# Patient Record
Sex: Female | Born: 1990 | Race: White | Hispanic: No | Marital: Married | Smoking: Never smoker
Health system: Southern US, Community
[De-identification: ages and names within clinical notes are randomized; demographics above are authoritative.]

## PROBLEM LIST (undated history)

## (undated) DIAGNOSIS — N83209 Unspecified ovarian cyst, unspecified side: Secondary | ICD-10-CM

## (undated) DIAGNOSIS — K602 Anal fissure, unspecified: Secondary | ICD-10-CM

## (undated) DIAGNOSIS — B379 Candidiasis, unspecified: Secondary | ICD-10-CM

## (undated) DIAGNOSIS — R1013 Epigastric pain: Secondary | ICD-10-CM

## (undated) DIAGNOSIS — K648 Other hemorrhoids: Secondary | ICD-10-CM

## (undated) DIAGNOSIS — D2310 Other benign neoplasm of skin of unspecified eyelid, including canthus: Secondary | ICD-10-CM

## (undated) DIAGNOSIS — K219 Gastro-esophageal reflux disease without esophagitis: Secondary | ICD-10-CM

## (undated) DIAGNOSIS — K589 Irritable bowel syndrome without diarrhea: Secondary | ICD-10-CM

## (undated) DIAGNOSIS — N390 Urinary tract infection, site not specified: Secondary | ICD-10-CM

## (undated) DIAGNOSIS — B009 Herpesviral infection, unspecified: Secondary | ICD-10-CM

## (undated) HISTORY — DX: Urinary tract infection, site not specified: N39.0

## (undated) HISTORY — DX: Gastro-esophageal reflux disease without esophagitis: K21.9

## (undated) HISTORY — DX: Other benign neoplasm of skin of unspecified eyelid, including canthus: D23.10

## (undated) HISTORY — DX: Unspecified ovarian cyst, unspecified side: N83.209

## (undated) HISTORY — PX: TYMPANOSTOMY: SHX2586

## (undated) HISTORY — DX: Epigastric pain: R10.13

## (undated) HISTORY — DX: Other hemorrhoids: K64.8

## (undated) HISTORY — PX: TONSILLECTOMY: SUR1361

## (undated) HISTORY — PX: NASAL SINUS SURGERY: SHX719

## (undated) HISTORY — PX: MYRINGOTOMY: SUR874

## (undated) HISTORY — DX: Anal fissure, unspecified: K60.2

---
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
## 2011-01-13 ENCOUNTER — Encounter: Payer: Self-pay | Admitting: Internal Medicine

## 2011-01-13 ENCOUNTER — Ambulatory Visit (INDEPENDENT_AMBULATORY_CARE_PROVIDER_SITE_OTHER): Payer: 59 | Admitting: Internal Medicine

## 2011-01-13 VITALS — BP 118/64 | HR 88 | Ht 67.0 in | Wt 107.0 lb

## 2011-01-13 DIAGNOSIS — R634 Abnormal weight loss: Secondary | ICD-10-CM

## 2011-01-13 DIAGNOSIS — R198 Other specified symptoms and signs involving the digestive system and abdomen: Secondary | ICD-10-CM

## 2011-01-13 DIAGNOSIS — K3189 Other diseases of stomach and duodenum: Secondary | ICD-10-CM

## 2011-01-13 DIAGNOSIS — R1013 Epigastric pain: Secondary | ICD-10-CM

## 2011-01-13 MED ORDER — BUSPIRONE HCL 10 MG PO TABS: ORAL_TABLET | ORAL | Status: DC

## 2011-01-13 NOTE — Patient Instructions (Addendum)
Your prescription(s) has(have) been sent to your pharmacy for you to pick up. Stop the Align. Return to see Dr. Leone Payor in 6 weeks.

## 2011-01-13 NOTE — Progress Notes (Signed)
  Subjective:    Patient ID: Crystal [name], female    DOB: [birth year], 20 y.o.   MRN: [id number]  HPIVery pleasant 20 yo white woman that has been seen for abdominal pain and weight loss over the past several months. She recently underwent a  Diagnostic laparoscopy by her GYN MD and that was unremarkable. She is here for reassessment today with persistent difficulties with early satiety and post-prandil abdominal pains.  Appetite present but has significant early satiety, nausea and bloating.. No diet change. Has lost weight, was 115 and now #107. Out of school, doing some dog sitting. Says life is stress-free. + headaches, past 3 days straight Overall not typical. Sleeping ok, maybe too good she says. Smiling and laughing in interview. Mom is present for latter part.     Review of Systems As per HPI    Objective:   Physical Exam Thin but healthy-appearing Abdomen scaphoid, soft and nontender with normal BS Appropriate mood and affect, sense of humor displayed in interview       Assessment & Plan:

## 2011-01-13 NOTE — Assessment & Plan Note (Addendum)
Still bothered with early satiety and weight loss. 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 Does not seem to have other issues except ? "sleeping too much" though do not get a sense of significant fatigue Will try buspirone. Rationale to improve gastric accomodation explained to patient and mom. Plan follow-up in about 6 weeks.

## 2011-01-14 NOTE — Assessment & Plan Note (Signed)
Still an issue. I encouraged frequent, smaller and high-calorie intake. Await trial of buspirone.

## 2011-01-14 NOTE — Assessment & Plan Note (Signed)
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 EGD, laparoscopy, labs are unrevealing. Trial of buspirone which is known to help these symptoms. If that fails probable tertiary referral depending upon quality of life and weight status.

## 2011-02-23 ENCOUNTER — Encounter: Payer: Self-pay | Admitting: Internal Medicine

## 2011-02-23 ENCOUNTER — Ambulatory Visit (INDEPENDENT_AMBULATORY_CARE_PROVIDER_SITE_OTHER): Payer: 59 | Admitting: Internal Medicine

## 2011-02-23 VITALS — BP 98/62 | HR 74 | Ht 67.0 in | Wt 110.0 lb

## 2011-02-23 DIAGNOSIS — R198 Other specified symptoms and signs involving the digestive system and abdomen: Secondary | ICD-10-CM

## 2011-02-23 DIAGNOSIS — R1013 Epigastric pain: Secondary | ICD-10-CM

## 2011-02-23 DIAGNOSIS — K3189 Other diseases of stomach and duodenum: Secondary | ICD-10-CM

## 2011-02-23 NOTE — Progress Notes (Signed)
"   I love eating a lot - I am hungry" Doing well since buspirone No nightmares or dystonia - sleep at times so taking in afternoon and bedtime Still has headaches relieved by ibuprofen - 4 times a week

## 2011-02-23 NOTE — Patient Instructions (Addendum)
You may try Buspirone 1 tablet at night only or 1/2 tablet to reduce your sleepiness to help you  feel better.  Have your pharmacy contact our office when it's time for a refill.

## 2011-02-24 NOTE — Assessment & Plan Note (Signed)
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

## 2011-02-24 NOTE — Assessment & Plan Note (Signed)
Treated with buspirone

## 2011-02-24 NOTE — Assessment & Plan Note (Signed)
Significant improvement in symptoms (minimal pain, appetite and eight are up) on buspirone 10 mg bid. Some sleepiness with that. She will titrate dosing ? 5 mg bid, 10 mg qhs to seee if she can reduce side effects and maintain efficacy.

## 2011-03-01 ENCOUNTER — Ambulatory Visit (INDEPENDENT_AMBULATORY_CARE_PROVIDER_SITE_OTHER): Payer: 59 | Admitting: *Deleted

## 2011-03-01 DIAGNOSIS — Z0000 Encounter for general adult medical examination without abnormal findings: Secondary | ICD-10-CM

## 2011-03-03 ENCOUNTER — Encounter: Payer: Self-pay | Admitting: *Deleted

## 2011-03-03 LAB — TB SKIN TEST
Induration: 0
TB Skin Test: NEGATIVE mm

## 2011-04-06 ENCOUNTER — Inpatient Hospital Stay (INDEPENDENT_AMBULATORY_CARE_PROVIDER_SITE_OTHER)
Admission: RE | Admit: 2011-04-06 | Discharge: 2011-04-06 | Disposition: A | Discharge status: Home / Self Care | Payer: 59 | Source: Ambulatory Visit | Attending: Family Medicine | Admitting: Family Medicine

## 2011-04-06 ENCOUNTER — Encounter: Payer: Self-pay | Admitting: Family Medicine

## 2011-04-06 DIAGNOSIS — J069 Acute upper respiratory infection, unspecified: Secondary | ICD-10-CM

## 2011-05-02 LAB — URINALYSIS, ROUTINE W REFLEX MICROSCOPIC
Bilirubin Urine: NEGATIVE
Glucose, UA: NEGATIVE
Hgb urine dipstick: NEGATIVE
Ketones, ur: 15 — AB
Nitrite: NEGATIVE
Protein, ur: NEGATIVE
Specific Gravity, Urine: 1.013
Urobilinogen, UA: 1
pH: 5.5

## 2011-05-02 LAB — URINE MICROSCOPIC-ADD ON

## 2011-05-02 LAB — URINE CULTURE: Colony Count: 10000

## 2011-05-02 LAB — PREGNANCY, URINE: Preg Test, Ur: NEGATIVE

## 2011-05-02 LAB — MONONUCLEOSIS SCREEN: Mono Screen: NEGATIVE

## 2011-05-02 LAB — RAPID STREP SCREEN (MED CTR MEBANE ONLY): Streptococcus, Group A Screen (Direct): NEGATIVE

## 2011-05-15 LAB — POCT HEMOGLOBIN-HEMACUE
Hemoglobin: 14.8 — ABNORMAL HIGH
Operator id: 154361

## 2011-07-03 NOTE — Progress Notes (Signed)
Summary: Possible URI/sinus infx (room 3)   Vital Signs:  Patient Profile:   20 Years Old Female CC:      possible sinus infx x 2 days (hx of) Height:     64.5 inches Weight:      111.50 pounds O2 Sat:      96 % O2 treatment:    Room Air Temp:     98.4 degrees F oral Pulse rate:   81 / minute Resp:     16 per minute BP sitting:   118 / 81  (left arm) Cuff size:   regular  Pt. in pain?   yes    Location:   sinus area  Vitals Entered By: Lavell Islam RN (April 06, 2011 10:08 AM)                   Updated Prior Medication List: * MVI 1 by mouth once daily * HAIR, SKIN, AND NAILS VIT 1 by mouth once daily ZEOSA 0.4-35 MG-MCG CHEW (NORETHIN-ETH ESTRADIOL-FE) as directed BUSPIRONE HCL 10 MG TABS (BUSPIRONE HCL) as needed for GI pain  Current Allergies: No known allergies History of Present Illness Chief Complaint: possible sinus infx x 2 days (hx of) History of Present Illness:  Subjective: Patient complains of URI symptoms for 5 days. + mild sore throat, resolved + cough worse at night No pleuritic pain No wheezing + nasal congestion ? post-nasal drainage No sinus pain/pressure No itchy/red eyes ? earache; ears feel congested No hemoptysis No SOB No fever, + chills No nausea No vomiting No abdominal pain No diarrhea No skin rashes + fatigue No myalgias + headache Used OTC meds without relief   REVIEW OF SYSTEMS Constitutional Symptoms       Complains of fever.     Denies chills, night sweats, weight loss, weight gain, and fatigue.  Eyes       Denies change in vision, eye pain, eye discharge, glasses, contact lenses, and eye surgery. Ear/Nose/Throat/Mouth       Complains of frequent runny nose, sinus problems, and sore throat.      Denies hearing loss/aids, change in hearing, ear pain, ear discharge, dizziness, frequent nose bleeds, hoarseness, and tooth pain or bleeding.  Respiratory       Complains of dry cough.      Denies productive cough,  wheezing, shortness of breath, asthma, bronchitis, and emphysema/COPD.  Cardiovascular       Denies murmurs, chest pain, and tires easily with exhertion.    Gastrointestinal       Denies stomach pain, nausea/vomiting, diarrhea, constipation, blood in bowel movements, and indigestion. Genitourniary       Denies painful urination, kidney stones, and loss of urinary control. Neurological       Complains of headaches.      Denies paralysis, seizures, and fainting/blackouts. Musculoskeletal       Denies muscle pain, joint pain, joint stiffness, decreased range of motion, redness, swelling, muscle weakness, and gout.  Skin       Denies bruising, unusual mles/lumps or sores, and hair/skin or nail changes.  Psych       Denies mood changes, temper/anger issues, anxiety/stress, speech problems, depression, and sleep problems. Other Comments: URI vs. sinus infx   Past History:  Past Medical History: GERD Urinary Tract Infection Ovarian cyst sinus infxs pneumonia mono strep  Past Surgical History: myringotomy x 2  tympanostomy  tonsillectomy sinus surgery exploratory Lap 5/12   Family History: Reviewed history from 10/10/2010 and  no changes required. Mother, Diabetes, HTN Father, Healthy No FH of Colon Cancer: Family History of Breast Cancer:MGM Family History of Heart Disease: MGF Family History of Irritable Bowel Syndrome: Fathe   Social History: Reviewed history from 10/10/2010 and no changes required. 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  Alcohol Use - no Illicit Drug Use - no Daily Caffeine Use: 0 daily    Objective:  Appearance:  Patient appears healthy, stated age, and in no acute distress  Eyes:  Pupils are equal, round, and reactive to light and accomodation.  Extraocular movement is intact.  Conjunctivae are not inflamed.  Ears:  Canals normal.  Tympanic membranes normal.   Nose:  Mildly congested  turbinates.  No sinus tenderness  Pharynx:  Normal  Neck:  Supple.  No adenopathy is present.  Lungs:  Clear to auscultation.  Breath sounds are equal.  Heart:  Regular rate and rhythm without murmurs, rubs, or gallops.  Abdomen:  Nontender without masses or hepatosplenomegaly.  Bowel sounds are present.  No CVA or flank tenderness.  Skin:  No rash Assessment New Problems: UPPER RESPIRATORY INFECTION, ACUTE (ICD-465.9)  NO EVIDENCE BACTERIAL INFECTION TODAY  Plan New Medications/Changes: AZITHROMYCIN 250 MG TABS (AZITHROMYCIN) Two tabs by mouth on day 1, then 1 tab daily on days 2 through 5 (Rx void after 04/14/11)  #6 tabs x 0, 04/06/2011, Donna Christen MD BENZONATATE 200 MG CAPS (BENZONATATE) One by mouth hs as needed cough  #12 x 0, 04/06/2011, Donna Christen MD  New Orders: Est. Patient Level III [60454] Pulse Oximetry (single measurment) [09811] Planning Comments:   Treat symptomatically for now:  Increase fluid intake, begin expectorant/decongestant, topical decongestant,  cough suppressant at bedtime.  If fever/chills/sweats persist, or if not improving 5  days begin Z-pack (given Rx to hold).  Followup with PCP if not improving 7 to 10 days.   The patient and/or caregiver has been counseled thoroughly with regard to medications prescribed including dosage, schedule, interactions, rationale for use, and possible side effects and they verbalize understanding.  Diagnoses and expected course of recovery discussed and will return if not improved as expected or if the condition worsens. Patient and/or caregiver verbalized understanding.  Prescriptions: AZITHROMYCIN 250 MG TABS (AZITHROMYCIN) Two tabs by mouth on day 1, then 1 tab daily on days 2 through 5 (Rx void after 04/14/11)  #6 tabs x 0   Entered and Authorized by:   Donna Christen MD   Signed by:   Donna Christen MD on 04/06/2011   Method used:   Print then Give to Patient   RxID:   [id number] BENZONATATE 200 MG CAPS  (BENZONATATE) One by mouth hs as needed cough  #12 x 0   Entered and Authorized by:   Donna Christen MD   Signed by:   Donna Christen MD on 04/06/2011   Method used:   Print then Give to Patient   RxID:   [id number]   Patient Instructions: 1)  Take Mucinex D (guaifenesin with decongestant) twice daily for congestion. 2)  Increase fluid intake, rest. 3)  May use Afrin nasal spray (or generic oxymetazoline) twice daily for about 5 days.  Also recommend using saline nasal spray several times daily and/or saline nasal irrigation. 4)  Begin Azithromycin if not improving about 5 days or if persistent fever develops. 5)  Followup with family doctor if not improving 7 to 10 days.   Orders Added: 1)  Est. Patient Level  III [13086] 2)  Pulse Oximetry (single measurment) [57846]

## 2011-10-03 ENCOUNTER — Telehealth: Payer: Self-pay | Admitting: Internal Medicine

## 2011-10-03 NOTE — Telephone Encounter (Signed)
Patient reports intermittent rectal bleeding about once a week.  Denies abdominal pain.  She is not sure where that came from in the information for the call.  She reports that she is unable to gain weight and this is upsetting.  She reports that she is 107 lbs and has been this way for 1 year.  The patient will come in and see Dr Leone Payor on 10/09/11

## 2011-10-09 ENCOUNTER — Ambulatory Visit (INDEPENDENT_AMBULATORY_CARE_PROVIDER_SITE_OTHER): Payer: 59 | Admitting: Internal Medicine

## 2011-10-09 ENCOUNTER — Encounter: Payer: Self-pay | Admitting: Internal Medicine

## 2011-10-09 VITALS — BP 100/70 | HR 68 | Ht 67.0 in | Wt 115.0 lb

## 2011-10-09 DIAGNOSIS — K648 Other hemorrhoids: Secondary | ICD-10-CM | POA: Insufficient documentation

## 2011-10-09 DIAGNOSIS — K602 Anal fissure, unspecified: Secondary | ICD-10-CM

## 2011-10-09 HISTORY — DX: Anal fissure, unspecified: K60.2

## 2011-10-09 HISTORY — DX: Other hemorrhoids: K64.8

## 2011-10-09 MED ORDER — AMBULATORY NON FORMULARY MEDICATION: Status: DC

## 2011-10-09 NOTE — Progress Notes (Signed)
  Subjective:    Patient ID: Crystal [name], female    DOB: 1990/10/12, 21 y.o.   MRN: [id number]  HPI Pleasant 21 yo ww with 1 month history of painful defecation and bright red blood on toilet paper. She denies abdominal pain. No straining or hard stools. Wants to avoid defecation because of the painful defecation. Has never had problems like this before. Hoping to transfer from [facility] to [facility] for nusring.  No Known Allergies Outpatient Prescriptions Prior to Visit  Medication Sig Dispense Refill  . busPIRone (BUSPAR) 10 MG tablet 1/2 tab twice a day for 2 weeks then 1 tab twice a day  60 tablet  2  . desogestrel-ethinyl estradiol (KARIVA,AZURETTE,MIRCETTE) 0.15-0.02/0.01 MG (21/5) tablet Take 1 tablet by mouth daily.        . Multiple Vitamin (MULTIVITAMIN) capsule Take 1 capsule by mouth daily.        . Multiple Vitamins-Minerals (HAIR/SKIN/NAILS PO) Take 1 tablet by mouth daily.         Past Medical History  Diagnosis Date  . GERD (gastroesophageal reflux disease)   . UTI (lower urinary tract infection)   . Ovarian cyst   . Dyspepsia   . Benign neoplasm of eyelid including canthus    Past Surgical History  Procedure Date  . Myringotomy     x2  . Tympanostomy   . Tonsillectomy   . Nasal sinus surgery   . Laparoscopy 12/19/10  . 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 Anal fissure   2. Internal hemorrhoids    #1 diltiazem gel 2% twice daily to the anal area #2 Metamucil 1 tablespoon daily to ensure formed to soft stools #3 return in 6 weeks for followup. Have explained the nature of anal fissure  and hemorrhoids. Education handout will be given.

## 2011-10-09 NOTE — Patient Instructions (Signed)
We have sent the following medications to your pharmacy for you to pick up at your convenience: Diltiazem gel and take as directed. Start taking Metamucil one tablespoon daily and this medication is over the counter. We have given you a handout on Anal Fissure today.

## 2011-10-11 ENCOUNTER — Ambulatory Visit: Payer: 59 | Admitting: Internal Medicine

## 2012-01-08 ENCOUNTER — Emergency Department
Admission: EM | Admit: 2012-01-08 | Discharge: 2012-01-08 | Disposition: A | Discharge status: Home / Self Care | Payer: Self-pay | Source: Home / Self Care | Attending: Family Medicine | Admitting: Family Medicine

## 2012-01-08 ENCOUNTER — Encounter: Payer: Self-pay | Admitting: *Deleted

## 2012-01-08 DIAGNOSIS — H6980 Other specified disorders of Eustachian tube, unspecified ear: Secondary | ICD-10-CM

## 2012-01-08 DIAGNOSIS — J029 Acute pharyngitis, unspecified: Secondary | ICD-10-CM

## 2012-01-08 LAB — POCT RAPID STREP A (OFFICE): Rapid Strep A Screen: NEGATIVE

## 2012-01-08 MED ORDER — PREDNISONE 20 MG PO TABS: 20.0000 mg | ORAL_TABLET | Freq: Two times a day (BID) | ORAL | Status: AC

## 2012-01-08 NOTE — Discharge Instructions (Signed)
Take Mucinex D (guaifenesin with decongestant) twice daily for congestion.  Increase fluid intake, rest. May use Afrin nasal spray (or generic oxymetazoline) twice daily for about 5 days.  Also recommend using saline nasal spray several times daily and saline nasal irrigation (AYR is a common brand) Stop all antihistamines for now, and other non-prescription cough/cold preparations.     

## 2012-01-08 NOTE — ED Notes (Signed)
Pt c/o sore throat and RT ear ache x 1wk. Denies fever. She has taken zyrtec and tylenol.

## 2012-01-08 NOTE — ED Provider Notes (Signed)
History     CSN: [id number]  Arrival date & time 01/08/12  1000   First MD Initiated Contact with Patient 01/08/12 1036      Chief Complaint  Patient presents with  . Sore Throat  . Otalgia      HPI Comments: Patient complains of approximately 7 day history of persistent sinus congestion and mild soreness in right neck/throat.  She has had a minimal cough.  No fatigue or myalgias.  She states that her right ear feels clogged.  There has been no pleuritic pain, shortness of breath, or wheezes.  No fevers, chills, and sweats.  She states that her congestion did not respond to Zyrtec.  She has had sinus surgery in the past.  The history is provided by the patient.    Past Medical History  Diagnosis Date  . GERD (gastroesophageal reflux disease)   . UTI (lower urinary tract infection)   . Ovarian cyst   . Dyspepsia   . Benign neoplasm of eyelid including canthus     Past Surgical History  Procedure Date  . Myringotomy     x2  . Tympanostomy   . Tonsillectomy   . Nasal sinus surgery   . Laparoscopy 12/19/10  . Upper gastrointestinal endoscopy 10/17/2010    normal    Family History  Problem Relation Age of Onset  . Diabetes Mother   . Hypertension Mother   . Irritable bowel syndrome Father   . Breast cancer Maternal Grandmother   . Heart disease Maternal Grandfather   . Colon cancer Neg Hx     History  Substance Use Topics  . Smoking status: Never Smoker   . Smokeless tobacco: Never Used  . Alcohol Use: No    OB History    Grav Para Term Preterm Abortions TAB SAB Ect Mult Living                  Review of Systems + sore throat + occasional cough No pleuritic pain No wheezing + nasal congestion ? post-nasal drainage No sinus pain/pressure No itchy/red eyes ? right earache; right ear feels clogged No hemoptysis No SOB No fever/chills No nausea No vomiting No abdominal pain No diarrhea No urinary symptoms No skin rashes No fatigue No  myalgias + frontal headache Used OTC meds without relief (Zyrtec) Allergies  Review of patient's allergies indicates no known allergies.  Home Medications   Current Outpatient Rx  Name Route Sig Dispense Refill  . AMBULATORY NON FORMULARY MEDICATION  Medication Name: Diltiazem gel 2% Apply small amount to anal area twice a day 30 g 1  . BUSPIRONE HCL 10 MG PO TABS  1/2 tab twice a day for 2 weeks then 1 tab twice a day 60 tablet 2  . DESOGESTREL-ETHINYL ESTRADIOL 0.15-0.02/0.01 MG (21/5) PO TABS Oral Take 1 tablet by mouth daily.      . MULTIVITAMINS PO CAPS Oral Take 1 capsule by mouth daily.      Marland Kitchen HAIR/SKIN/NAILS PO Oral Take 1 tablet by mouth daily.      Marland Kitchen PREDNISONE 20 MG PO TABS Oral Take 1 tablet (20 mg total) by mouth 2 (two) times daily. Take with food 8 tablet 0    BP 104/67  Pulse 88  Temp(Src) 98.8 F (37.1 C) (Oral)  Resp 16  Ht 5\' 6"  (1.676 m)  Wt 126 lb 4 oz (57.267 kg)  BMI 20.38 kg/m2  SpO2 99%  LMP 12/11/2011  Physical Exam Nursing notes and Vital Signs  reviewed. Appearance:  Patient appears healthy, stated age, and in no acute distress Eyes:  Pupils are equal, round, and reactive to light and accomodation.  Extraocular movement is intact.  Conjunctivae are not inflamed  Ears:  Canals normal.   Right tympanic membrane is scarred but no acute inflammation present.  Left tympanic membrane normal Nose:  Mildly congested turbinates.  No sinus tenderness.  Pharynx:  Normal Neck:  Supple.  Slightly tender shotty right posterior nodes  Lungs:  Clear to auscultation.  Breath sounds are equal.  Heart:  Regular rate and rhythm without murmurs, rubs, or gallops.  Abdomen:  Nontender without masses or hepatosplenomegaly.  Bowel sounds are present.  No CVA or flank tenderness.  Skin:  No rash present.   ED Course  Procedures none   Labs Reviewed  POCT RAPID STREP A (OFFICE) negative      1. Eustachian tube dysfunction   Tympanogram reveals consistent open  message right ear (no perforation seen however)    MDM  Begin prednisone burst. Take Mucinex D (guaifenesin with decongestant) twice daily for congestion.  Increase fluid intake May use Afrin nasal spray (or generic oxymetazoline) twice daily for about 5 days.  Also recommend using saline nasal spray several times daily and saline nasal irrigation (AYR is a common brand) Stop all antihistamines for now, and other non-prescription cough/cold preparations. Followup with ENT if not improving          Lattie Haw, MD 01/08/12 1105

## 2012-01-26 ENCOUNTER — Ambulatory Visit (INDEPENDENT_AMBULATORY_CARE_PROVIDER_SITE_OTHER): Payer: 59 | Admitting: Family Medicine

## 2012-01-26 ENCOUNTER — Encounter: Payer: Self-pay | Admitting: Family Medicine

## 2012-01-26 VITALS — BP 110/64 | HR 79 | Temp 97.9°F | Resp 16 | Ht 65.0 in | Wt 123.8 lb

## 2012-01-26 DIAGNOSIS — Z0000 Encounter for general adult medical examination without abnormal findings: Secondary | ICD-10-CM

## 2012-01-26 DIAGNOSIS — Z23 Encounter for immunization: Secondary | ICD-10-CM

## 2012-01-26 DIAGNOSIS — Z111 Encounter for screening for respiratory tuberculosis: Secondary | ICD-10-CM

## 2012-01-26 LAB — CBC WITH DIFFERENTIAL/PLATELET
Basophils Absolute: 0 10*3/uL (ref 0.0–0.1)
Basophils Relative: 1 % (ref 0–1)
Eosinophils Absolute: 0 10*3/uL (ref 0.0–0.7)
Eosinophils Relative: 0 % (ref 0–5)
HCT: 38.3 % (ref 36.0–46.0)
Hemoglobin: 13.8 g/dL (ref 12.0–15.0)
Lymphocytes Relative: 32 % (ref 12–46)
Lymphs Abs: 2.3 10*3/uL (ref 0.7–4.0)
MCH: 31.7 pg (ref 26.0–34.0)
MCHC: 36 g/dL (ref 30.0–36.0)
MCV: 88 fL (ref 78.0–100.0)
Monocytes Absolute: 0.5 10*3/uL (ref 0.1–1.0)
Monocytes Relative: 8 % (ref 3–12)
Neutro Abs: 4.3 10*3/uL (ref 1.7–7.7)
Neutrophils Relative %: 59 % (ref 43–77)
Platelets: 243 10*3/uL (ref 150–400)
RBC: 4.35 MIL/uL (ref 3.87–5.11)
RDW: 12.4 % (ref 11.5–15.5)
WBC: 7.2 10*3/uL (ref 4.0–10.5)

## 2012-01-26 LAB — POCT URINALYSIS DIPSTICK
Bilirubin, UA: NEGATIVE
Blood, UA: NEGATIVE
Glucose, UA: NEGATIVE
Ketones, UA: NEGATIVE
Leukocytes, UA: NEGATIVE
Nitrite, UA: NEGATIVE
Protein, UA: NEGATIVE
Spec Grav, UA: 1.025
Urobilinogen, UA: 0.2
pH, UA: 6

## 2012-01-26 NOTE — Patient Instructions (Addendum)
Preventive Care for Adults, Female A healthy lifestyle and preventive care can promote health and wellness. Preventive health guidelines for women include the following key practices.  A routine yearly physical is a good way to check with your caregiver about your health and preventive screening. It is a chance to share any concerns and updates on your health, and to receive a thorough exam.   Visit your dentist for a routine exam and preventive care every 6 months. Brush your teeth twice a day and floss once a day. Good oral hygiene prevents tooth decay and gum disease.   The frequency of eye exams is based on your age, health, family medical history, use of contact lenses, and other factors. Follow your caregiver's recommendations for frequency of eye exams.   Eat a healthy diet. Foods like vegetables, fruits, whole grains, low-fat dairy products, and lean protein foods contain the nutrients you need without too many calories. Decrease your intake of foods high in solid fats, added sugars, and salt. Eat the right amount of calories for you.Get information about a proper diet from your caregiver, if necessary.   Regular physical exercise is one of the most important things you can do for your health. Most adults should get at least 150 minutes of moderate-intensity exercise (any activity that increases your heart rate and causes you to sweat) each week. In addition, most adults need muscle-strengthening exercises on 2 or more days a week.   Maintain a healthy weight. The body mass index (BMI) is a screening tool to identify possible weight problems. It provides an estimate of body fat based on height and weight. Your caregiver can help determine your BMI, and can help you achieve or maintain a healthy weight.For adults 20 years and older:   A BMI below 18.5 is considered underweight.   A BMI of 18.5 to 24.9 is normal.   A BMI of 25 to 29.9 is considered overweight.   A BMI of 30 and above is  considered obese.   Maintain normal blood lipids and cholesterol levels by exercising and minimizing your intake of saturated fat. Eat a balanced diet with plenty of fruit and vegetables. Blood tests for lipids and cholesterol should begin at age 20 and be repeated every 5 years. If your lipid or cholesterol levels are high, you are over 50, or you are at high risk for heart disease, you may need your cholesterol levels checked more frequently.Ongoing high lipid and cholesterol levels should be treated with medicines if diet and exercise are not effective.   If you smoke, find out from your caregiver how to quit. If you do not use tobacco, do not start.   If you are pregnant, do not drink alcohol. If you are breastfeeding, be very cautious about drinking alcohol. If you are not pregnant and choose to drink alcohol, do not exceed 1 drink per day. One drink is considered to be 12 ounces (355 mL) of beer, 5 ounces (148 mL) of wine, or 1.5 ounces (44 mL) of liquor.   Avoid use of street drugs. Do not share needles with anyone. Ask for help if you need support or instructions about stopping the use of drugs.   High blood pressure causes heart disease and increases the risk of stroke. Your blood pressure should be checked at least every 1 to 2 years. Ongoing high blood pressure should be treated with medicines if weight loss and exercise are not effective.   If you are 55 to 20   years old, ask your caregiver if you should take aspirin to prevent strokes.   Diabetes screening involves taking a blood sample to check your fasting blood sugar level. This should be done once every 3 years, after age 45, if you are within normal weight and without risk factors for diabetes. Testing should be considered at a younger age or be carried out more frequently if you are overweight and have at least 1 risk factor for diabetes.   Breast cancer screening is essential preventive care for women. You should practice "breast  self-awareness." This means understanding the normal appearance and feel of your breasts and may include breast self-examination. Any changes detected, no matter how small, should be reported to a caregiver. Women in their 20s and 30s should have a clinical breast exam (CBE) by a caregiver as part of a regular health exam every 1 to 3 years. After age 40, women should have a CBE every year. Starting at age 40, women should consider having a mammography (breast X-ray test) every year. Women who have a family history of breast cancer should talk to their caregiver about genetic screening. Women at a high risk of breast cancer should talk to their caregivers about having magnetic resonance imaging (MRI) and a mammography every year.   The Pap test is a screening test for cervical cancer. A Pap test can show cell changes on the cervix that might become cervical cancer if left untreated. A Pap test is a procedure in which cells are obtained and examined from the lower end of the uterus (cervix).   Women should have a Pap test starting at age 21.   Between ages 21 and 29, Pap tests should be repeated every 2 years.   Beginning at age 30, you should have a Pap test every 3 years as long as the past 3 Pap tests have been normal.   Some women have medical problems that increase the chance of getting cervical cancer. Talk to your caregiver about these problems. It is especially important to talk to your caregiver if a new problem develops soon after your last Pap test. In these cases, your caregiver may recommend more frequent screening and Pap tests.   The above recommendations are the same for women who have or have not gotten the vaccine for human papillomavirus (HPV).   If you had a hysterectomy for a problem that was not cancer or a condition that could lead to cancer, then you no longer need Pap tests. Even if you no longer need a Pap test, a regular exam is a good idea to make sure no other problems are  starting.   If you are between ages 65 and 70, and you have had normal Pap tests going back 10 years, you no longer need Pap tests. Even if you no longer need a Pap test, a regular exam is a good idea to make sure no other problems are starting.   If you have had past treatment for cervical cancer or a condition that could lead to cancer, you need Pap tests and screening for cancer for at least 20 years after your treatment.   If Pap tests have been discontinued, risk factors (such as a new sexual partner) need to be reassessed to determine if screening should be resumed.   The HPV test is an additional test that may be used for cervical cancer screening. The HPV test looks for the virus that can cause the cell changes on the cervix.   The cells collected during the Pap test can be tested for HPV. The HPV test could be used to screen women aged 30 years and older, and should be used in women of any age who have unclear Pap test results. After the age of 30, women should have HPV testing at the same frequency as a Pap test.   Colorectal cancer can be detected and often prevented. Most routine colorectal cancer screening begins at the age of 50 and continues through age 75. However, your caregiver may recommend screening at an earlier age if you have risk factors for colon cancer. On a yearly basis, your caregiver may provide home test kits to check for hidden blood in the stool. Use of a small camera at the end of a tube, to directly examine the colon (sigmoidoscopy or colonoscopy), can detect the earliest forms of colorectal cancer. Talk to your caregiver about this at age 50, when routine screening begins. Direct examination of the colon should be repeated every 5 to 10 years through age 75, unless early forms of pre-cancerous polyps or small growths are found.   Hepatitis C blood testing is recommended for all people born from 1945 through 1965 and any individual with known risks for hepatitis C.    Practice safe sex. Use condoms and avoid high-risk sexual practices to reduce the spread of sexually transmitted infections (STIs). STIs include gonorrhea, chlamydia, syphilis, trichomonas, herpes, HPV, and human immunodeficiency virus (HIV). Herpes, HIV, and HPV are viral illnesses that have no cure. They can result in disability, cancer, and death. Sexually active women aged 25 and younger should be checked for chlamydia. Older women with new or multiple partners should also be tested for chlamydia. Testing for other STIs is recommended if you are sexually active and at increased risk.   Osteoporosis is a disease in which the bones lose minerals and strength with aging. This can result in serious bone fractures. The risk of osteoporosis can be identified using a bone density scan. Women ages 65 and over and women at risk for fractures or osteoporosis should discuss screening with their caregivers. Ask your caregiver whether you should take a calcium supplement or vitamin D to reduce the rate of osteoporosis.   Menopause can be associated with physical symptoms and risks. Hormone replacement therapy is available to decrease symptoms and risks. You should talk to your caregiver about whether hormone replacement therapy is right for you.   Use sunscreen with sun protection factor (SPF) of 30 or more. Apply sunscreen liberally and repeatedly throughout the day. You should seek shade when your shadow is shorter than you. Protect yourself by wearing long sleeves, pants, a wide-brimmed hat, and sunglasses year round, whenever you are outdoors.   Once a month, do a whole body skin exam, using a mirror to look at the skin on your back. Notify your caregiver of new moles, moles that have irregular borders, moles that are larger than a pencil eraser, or moles that have changed in shape or color.   Stay current with required immunizations.   Influenza. You need a dose every fall (or winter). The composition of  the flu vaccine changes each year, so being vaccinated once is not enough.   Pneumococcal polysaccharide. You need 1 to 2 doses if you smoke cigarettes or if you have certain chronic medical conditions. You need 1 dose at age 65 (or older) if you have never been vaccinated.   Tetanus, diphtheria, pertussis (Tdap, Td). Get 1 dose of   Tdap vaccine if you are younger than age 65, are over 65 and have contact with an infant, are a healthcare worker, are pregnant, or simply want to be protected from whooping cough. After that, you need a Td booster dose every 10 years. Consult your caregiver if you have not had at least 3 tetanus and diphtheria-containing shots sometime in your life or have a deep or dirty wound.   HPV. You need this vaccine if you are a woman age 26 or younger. The vaccine is given in 3 doses over 6 months.   Measles, mumps, rubella (MMR). You need at least 1 dose of MMR if you were born in 1957 or later. You may also need a second dose.   Meningococcal. If you are age 19 to 21 and a first-year college student living in a residence hall, or have one of several medical conditions, you need to get vaccinated against meningococcal disease. You may also need additional booster doses.   Zoster (shingles). If you are age 60 or older, you should get this vaccine.   Varicella (chickenpox). If you have never had chickenpox or you were vaccinated but received only 1 dose, talk to your caregiver to find out if you need this vaccine.   Hepatitis A. You need this vaccine if you have a specific risk factor for hepatitis A virus infection or you simply wish to be protected from this disease. The vaccine is usually given as 2 doses, 6 to 18 months apart.   Hepatitis B. You need this vaccine if you have a specific risk factor for hepatitis B virus infection or you simply wish to be protected from this disease. The vaccine is given in 3 doses, usually over 6 months.  Preventive Services /  Frequency Ages 19 to 39  Blood pressure check.** / Every 1 to 2 years.   Lipid and cholesterol check.** / Every 5 years beginning at age 20.   Clinical breast exam.** / Every 3 years for women in their 20s and 30s.   Pap test.** / Every 2 years from ages 21 through 29. Every 3 years starting at age 30 through age 65 or 70 with a history of 3 consecutive normal Pap tests.   HPV screening.** / Every 3 years from ages 30 through ages 65 to 70 with a history of 3 consecutive normal Pap tests.   Hepatitis C blood test.** / For any individual with known risks for hepatitis C.   Skin self-exam. / Monthly.   Influenza immunization.** / Every year.   Pneumococcal polysaccharide immunization.** / 1 to 2 doses if you smoke cigarettes or if you have certain chronic medical conditions.   Tetanus, diphtheria, pertussis (Tdap, Td) immunization. / A one-time dose of Tdap vaccine. After that, you need a Td booster dose every 10 years.   HPV immunization. / 3 doses over 6 months, if you are 26 and younger.   Measles, mumps, rubella (MMR) immunization. / You need at least 1 dose of MMR if you were born in 1957 or later. You may also need a second dose.   Meningococcal immunization. / 1 dose if you are age 19 to 21 and a first-year college student living in a residence hall, or have one of several medical conditions, you need to get vaccinated against meningococcal disease. You may also need additional booster doses.   Varicella immunization.** / Consult your caregiver.   Hepatitis A immunization.** / Consult your caregiver. 2 doses, 6 to 18 months   apart.   Hepatitis B immunization.** / Consult your caregiver. 3 doses usually over 6 months.  Ages 40 to 64  Blood pressure check.** / Every 1 to 2 years.   Lipid and cholesterol check.** / Every 5 years beginning at age 20.   Clinical breast exam.** / Every year after age 40.   Mammogram.** / Every year beginning at age 40 and continuing for as  long as you are in good health. Consult with your caregiver.   Pap test.** / Every 3 years starting at age 30 through age 65 or 70 with a history of 3 consecutive normal Pap tests.   HPV screening.** / Every 3 years from ages 30 through ages 65 to 70 with a history of 3 consecutive normal Pap tests.   Fecal occult blood test (FOBT) of stool. / Every year beginning at age 50 and continuing until age 75. You may not need to do this test if you get a colonoscopy every 10 years.   Flexible sigmoidoscopy or colonoscopy.** / Every 5 years for a flexible sigmoidoscopy or every 10 years for a colonoscopy beginning at age 50 and continuing until age 75.   Hepatitis C blood test.** / For all people born from 1945 through 1965 and any individual with known risks for hepatitis C.   Skin self-exam. / Monthly.   Influenza immunization.** / Every year.   Pneumococcal polysaccharide immunization.** / 1 to 2 doses if you smoke cigarettes or if you have certain chronic medical conditions.   Tetanus, diphtheria, pertussis (Tdap, Td) immunization.** / A one-time dose of Tdap vaccine. After that, you need a Td booster dose every 10 years.   Measles, mumps, rubella (MMR) immunization. / You need at least 1 dose of MMR if you were born in 1957 or later. You may also need a second dose.   Varicella immunization.** / Consult your caregiver.   Meningococcal immunization.** / Consult your caregiver.   Hepatitis A immunization.** / Consult your caregiver. 2 doses, 6 to 18 months apart.   Hepatitis B immunization.** / Consult your caregiver. 3 doses, usually over 6 months.  Ages 65 and over  Blood pressure check.** / Every 1 to 2 years.   Lipid and cholesterol check.** / Every 5 years beginning at age 20.   Clinical breast exam.** / Every year after age 40.   Mammogram.** / Every year beginning at age 40 and continuing for as long as you are in good health. Consult with your caregiver.   Pap test.** /  Every 3 years starting at age 30 through age 65 or 70 with a 3 consecutive normal Pap tests. Testing can be stopped between 65 and 70 with 3 consecutive normal Pap tests and no abnormal Pap or HPV tests in the past 10 years.   HPV screening.** / Every 3 years from ages 30 through ages 65 or 70 with a history of 3 consecutive normal Pap tests. Testing can be stopped between 65 and 70 with 3 consecutive normal Pap tests and no abnormal Pap or HPV tests in the past 10 years.   Fecal occult blood test (FOBT) of stool. / Every year beginning at age 50 and continuing until age 75. You may not need to do this test if you get a colonoscopy every 10 years.   Flexible sigmoidoscopy or colonoscopy.** / Every 5 years for a flexible sigmoidoscopy or every 10 years for a colonoscopy beginning at age 50 and continuing until age 75.   Hepatitis   C blood test.** / For all people born from 1945 through 1965 and any individual with known risks for hepatitis C.   Osteoporosis screening.** / A one-time screening for women ages 65 and over and women at risk for fractures or osteoporosis.   Skin self-exam. / Monthly.   Influenza immunization.** / Every year.   Pneumococcal polysaccharide immunization.** / 1 dose at age 65 (or older) if you have never been vaccinated.   Tetanus, diphtheria, pertussis (Tdap, Td) immunization. / A one-time dose of Tdap vaccine if you are over 65 and have contact with an infant, are a healthcare worker, or simply want to be protected from whooping cough. After that, you need a Td booster dose every 10 years.   Varicella immunization.** / Consult your caregiver.   Meningococcal immunization.** / Consult your caregiver.   Hepatitis A immunization.** / Consult your caregiver. 2 doses, 6 to 18 months apart.   Hepatitis B immunization.** / Check with your caregiver. 3 doses, usually over 6 months.  ** Family history and personal history of risk and conditions may change your caregiver's  recommendations. Document Released: 09/12/2001 Document Revised: 07/06/2011 Document Reviewed: 12/12/2010 [employer] Patient Information 2012 [employer]. 

## 2012-01-26 NOTE — Progress Notes (Signed)
  Subjective:     Crystal [name] is a 21 y.o. female and is here for a comprehensive physical exam. The patient reports no problems.  History   Social History  . Marital Status: Single    Spouse Name: N/A    Number of Children: N/A  . Years of Education: N/A   Occupational History  . Not on file.   Social History Main Topics  . Smoking status: Never Smoker   . Smokeless tobacco: Never Used  . Alcohol Use: No  . Drug Use: No  . Sexually Active: Not on file   Other Topics Concern  . 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 Pap Smear  03/09/2010  . Tetanus/tdap  03/10/2011  . Influenza Vaccine  04/30/2012    The following portions of the patient's history were reviewed and updated as appropriate: allergies, current medications, past family history, past medical history, past social history, past surgical history and problem list.  Review of Systems Review of Systems  Constitutional: Negative for activity change, appetite change and fatigue.  HENT: Negative for hearing loss, congestion, tinnitus and ear discharge.  dentist q42m Eyes: Negative for visual disturbance (see optho--no) Respiratory: Negative for cough, chest tightness and shortness of breath.   Cardiovascular: Negative for chest pain, palpitations and leg swelling.  Gastrointestinal: Negative for abdominal pain, diarrhea, constipation and abdominal distention.  Genitourinary: Negative for urgency, frequency, decreased urine volume and difficulty urinating.  Musculoskeletal: Negative for back pain, arthralgias and gait problem.  Skin: Negative for color change, pallor and rash.  Neurological: Negative for dizziness, light-headedness, numbness and headaches.  Hematological: Negative for adenopathy. Does not bruise/bleed easily.  Psychiatric/Behavioral: Negative for suicidal ideas, confusion, sleep  disturbance, self-injury, dysphoric mood, decreased concentration and agitation.      Objective:    BP 110/64  Pulse 79  Temp 97.9 F (36.6 C) (Oral)  Resp 16  Ht 5\' 5"  (1.651 m)  Wt 123 lb 12.8 oz (56.155 kg)  BMI 20.60 kg/m2  SpO2 97%  LMP 12/11/2011 General appearance: alert, cooperative, appears stated age and no distress Head: Normocephalic, without obvious abnormality, atraumatic Eyes: conjunctivae/corneas clear. PERRL, EOM's intact. Fundi benign. Ears: normal TM's and external ear canals both ears Nose: Nares normal. Septum midline. Mucosa normal. No drainage or sinus tenderness. Throat: lips, mucosa, and tongue normal; teeth and gums normal Neck: no adenopathy, no carotid bruit, no JVD, supple, symmetrical, trachea midline and thyroid not enlarged, symmetric, no tenderness/mass/nodules Back: symmetric, no curvature. ROM normal. No CVA tenderness. Lungs: clear to auscultation bilaterally Breasts: gyn Heart: regular rate and rhythm, S1, S2 normal, no murmur, click, rub or gallop Abdomen: soft, non-tender; bowel sounds normal; no masses,  no organomegaly Extremities: extremities normal, atraumatic, no cyanosis or edema Pulses: 2+ and symmetric Skin: Skin color, texture, turgor normal. No rashes or lesions Lymph nodes: Cervical, supraclavicular, and axillary nodes normal. Neurologic: Alert and oriented X 3, normal strength and tone. Normal symmetric reflexes. Normal coordination and gait psych-- no depression, no anxiety    Assessment:    Healthy female exam.      Plan:    ghm utd Check labs See After Visit Summary for Counseling Recommendations

## 2012-01-27 LAB — HEPATIC FUNCTION PANEL
ALT: 16 U/L (ref 0–35)
AST: 21 U/L (ref 0–37)
Albumin: 4.1 g/dL (ref 3.5–5.2)
Alkaline Phosphatase: 44 U/L (ref 39–117)
Bilirubin, Direct: 0.1 mg/dL (ref 0.0–0.3)
Indirect Bilirubin: 0.4 mg/dL (ref 0.0–0.9)
Total Bilirubin: 0.5 mg/dL (ref 0.3–1.2)
Total Protein: 6.5 g/dL (ref 6.0–8.3)

## 2012-01-27 LAB — LIPID PANEL
Cholesterol: 169 mg/dL (ref 0–200)
HDL: 65 mg/dL (ref >39)
LDL Cholesterol: 85 mg/dL (ref 0–99)
Total CHOL/HDL Ratio: 2.6 Ratio
Triglycerides: 93 mg/dL (ref <150)
VLDL: 19 mg/dL (ref 0–40)

## 2012-01-27 LAB — BASIC METABOLIC PANEL
BUN: 18 mg/dL (ref 6–23)
CO2: 24 mEq/L (ref 19–32)
Calcium: 9.1 mg/dL (ref 8.4–10.5)
Chloride: 104 mEq/L (ref 96–112)
Creat: 0.92 mg/dL (ref 0.50–1.10)
Glucose, Bld: 91 mg/dL (ref 70–99)
Potassium: 4.1 mEq/L (ref 3.5–5.3)
Sodium: 138 mEq/L (ref 135–145)

## 2012-01-27 LAB — TSH: TSH: 1.604 u[IU]/mL (ref 0.350–4.500)

## 2012-01-29 LAB — TB SKIN TEST
Induration: 0 mm
TB Skin Test: NEGATIVE

## 2012-05-15 ENCOUNTER — Encounter: Payer: Self-pay | Admitting: *Deleted

## 2012-05-15 ENCOUNTER — Emergency Department
Admission: EM | Admit: 2012-05-15 | Discharge: 2012-05-15 | Disposition: A | Discharge status: Home / Self Care | Payer: 59 | Source: Home / Self Care | Attending: Family Medicine | Admitting: Family Medicine

## 2012-05-15 DIAGNOSIS — J069 Acute upper respiratory infection, unspecified: Secondary | ICD-10-CM

## 2012-05-15 DIAGNOSIS — J111 Influenza due to unidentified influenza virus with other respiratory manifestations: Secondary | ICD-10-CM

## 2012-05-15 LAB — POCT INFLUENZA A/B
Influenza A, POC: NEGATIVE
Influenza B, POC: NEGATIVE

## 2012-05-15 MED ORDER — AMOXICILLIN 875 MG PO TABS: 875.0000 mg | ORAL_TABLET | Freq: Two times a day (BID) | ORAL | Status: DC

## 2012-05-15 MED ORDER — PREDNISONE 20 MG PO TABS: 20.0000 mg | ORAL_TABLET | Freq: Two times a day (BID) | ORAL | Status: DC

## 2012-05-15 MED ORDER — BENZONATATE 200 MG PO CAPS: 200.0000 mg | ORAL_CAPSULE | Freq: Every day | ORAL | Status: DC

## 2012-05-15 NOTE — ED Provider Notes (Signed)
History     CSN: [id number]  Arrival date & time 05/15/12  1211   First MD Initiated Contact with Patient 05/15/12 1215      Chief Complaint  Patient presents with  . Generalized Body Aches  . Fatigue      HPI Comments: Patient complains of 3 day history of gradually progressive URI symptoms beginning with a mild sore throat (now improved), followed by progressive nasal congestion.  A cough started next. Complains of fatigue and initial myalgias.  Cough is now worse at night and generally non-productive during the day.  There has been no pleuritic pain, shortness of breath, or wheezes.  She has a history of otitis media after respiratory infections.  The history is provided by the patient and a parent.    Past Medical History  Diagnosis Date  . GERD (gastroesophageal reflux disease)   . UTI (lower urinary tract infection)   . Ovarian cyst   . Dyspepsia   . Benign neoplasm of eyelid including canthus     Past Surgical History  Procedure Date  . Myringotomy     x2  . Tympanostomy   . Tonsillectomy   . Nasal sinus surgery   . Laparoscopy 12/19/10  . Upper gastrointestinal endoscopy 10/17/2010    normal    Family History  Problem Relation Age of Onset  . Diabetes Mother   . Hypertension Mother   . Irritable bowel syndrome Father   . Breast cancer Maternal Grandmother   . Heart disease Maternal Grandfather   . Colon cancer Neg Hx     History  Substance Use Topics  . Smoking status: Never Smoker   . Smokeless tobacco: Never Used  . Alcohol Use: No    OB History    Grav Para Term Preterm Abortions TAB SAB Ect Mult Living                  Review of Systems + sore throat + cough No pleuritic pain No wheezing + nasal congestion + post-nasal drainage No sinus pain/pressure No itchy/red eyes No earache No hemoptysis No SOB No fever, + chills No nausea No vomiting No abdominal pain No diarrhea No urinary symptoms No skin rashes + fatigue +  myalgias + headache Used OTC meds without relief  Allergies  Review of patient's allergies indicates no known allergies.  Home Medications   Current Outpatient Rx  Name Route Sig Dispense Refill  . AMBULATORY NON FORMULARY MEDICATION  Medication Name: Diltiazem gel 2% Apply small amount to anal area twice a day 30 g 1  . AMOXICILLIN 875 MG PO TABS Oral Take 1 tablet (875 mg total) by mouth 2 (two) times daily. (Rx void after 05/23/12) 20 tablet 0  . BENZONATATE 200 MG PO CAPS Oral Take 1 capsule (200 mg total) by mouth at bedtime. Take as needed for cough 12 capsule 0  . BUSPIRONE HCL 10 MG PO TABS  1/2 tab twice a day for 2 weeks then 1 tab twice a day 60 tablet 2  . DESOGESTREL-ETHINYL ESTRADIOL 0.15-0.02/0.01 MG (21/5) PO TABS Oral Take 1 tablet by mouth daily.      . MULTIVITAMINS PO CAPS Oral Take 1 capsule by mouth daily.      Marland Kitchen HAIR/SKIN/NAILS PO Oral Take 1 tablet by mouth daily.      Marland Kitchen PREDNISONE 20 MG PO TABS Oral Take 1 tablet (20 mg total) by mouth 2 (two) times daily. Take with food. 10 tablet 0  BP 111/71  Pulse 85  Temp 98.5 F (36.9 C) (Oral)  Resp 18  Ht 5\' 6"  (1.676 m)  Wt 135 lb (61.236 kg)  BMI 21.79 kg/m2  SpO2 100%  LMP 04/26/2012  Physical Exam Nursing notes and Vital Signs reviewed. Appearance:  Patient appears healthy, stated age, and in no acute distress Eyes:  Pupils are equal, round, and reactive to light and accomodation.  Extraocular movement is intact.  Conjunctivae are not inflamed  Ears:  Canals normal.  Tympanic membranes normal (scarring right tympanic membrane)  Nose:  Mildly congested turbinates, worse on right.  No sinus tenderness.   Pharynx:  Normal Neck:  Supple.  Tender shotty posterior nodes are palpated bilaterally  Lungs:  Clear to auscultation.  Breath sounds are equal. Chest:  Distinct tenderness to palpation over the mid-sternum.   Heart:  Regular rate and rhythm without murmurs, rubs, or gallops.  Abdomen:  Nontender  without masses or hepatosplenomegaly.  Bowel sounds are present.  No CVA or flank tenderness.  Extremities:  No edema.  No calf tenderness Skin:  No rash present.   ED Course  Procedures none   Labs Reviewed  POCT INFLUENZA A/B negative      1. Acute upper respiratory infections of unspecified site   2. Influenza-like illness       MDM   There is no evidence of bacterial infection today.   Treat symptomatically for now  With a history or recurring otitis media, will begin prednisone burst.  Prescription written for Benzonatate (Tessalon) to take at bedtime for night-time cough.  Take Mucinex D (guaifenesin with decongestant) twice daily for congestion.  Increase fluid intake, rest. May use Afrin nasal spray (or generic oxymetazoline) twice daily for about 5 days.  Also recommend using saline nasal spray several times daily and saline nasal irrigation (AYR is a common brand) Stop all antihistamines for now, and other non-prescription cough/cold preparations. May take Ibuprofen 200mg , 4 tabs every 8 hours with food for chest/sternum discomfort. Begin Amoxicillin if not improving about 5 days or if persistent fever develops (Given a prescription to hold, with an expiration date)  Follow-up with family doctor if not improving 7 to 10 days.         Lattie Haw, MD 05/15/12 1330

## 2012-05-15 NOTE — ED Notes (Addendum)
Patient c/o 3 days of increasing body aches, fatigue, chills/sweats, ear pain, HA, sore throat and dry cough. Taken tylenol cold/flu.

## 2012-05-17 ENCOUNTER — Telehealth: Payer: Self-pay | Admitting: *Deleted

## 2012-05-23 ENCOUNTER — Ambulatory Visit (INDEPENDENT_AMBULATORY_CARE_PROVIDER_SITE_OTHER): Payer: 59

## 2012-05-23 DIAGNOSIS — Z23 Encounter for immunization: Secondary | ICD-10-CM

## 2012-07-27 ENCOUNTER — Emergency Department
Admission: EM | Admit: 2012-07-27 | Discharge: 2012-07-27 | Disposition: A | Discharge status: Home / Self Care | Payer: 59 | Source: Home / Self Care | Attending: Family Medicine | Admitting: Family Medicine

## 2012-07-27 DIAGNOSIS — B002 Herpesviral gingivostomatitis and pharyngotonsillitis: Secondary | ICD-10-CM

## 2012-07-27 MED ORDER — VALACYCLOVIR HCL 1 G PO TABS: ORAL_TABLET | ORAL | Status: DC

## 2012-07-27 NOTE — ED Notes (Signed)
Noted cold sore to lower right side of lip yesterday

## 2012-07-27 NOTE — ED Provider Notes (Signed)
History     CSN: [id number]  Arrival date & time 07/27/12  1008   First MD Initiated Contact with Patient 07/27/12 1143      Chief Complaint  Patient presents with  . Mouth Lesions    (Consider location/radiation/quality/duration/timing/severity/associated sxs/prior treatment) HPI Comments: Patient complains of onset of painful cold sore to her right lower lip yesterday.  No other symptoms.  She has had good response in past to Valtrex.  Patient is a 21 y.o. female presenting with mouth sores. The history is provided by the patient.  Mouth Lesions  The current episode started yesterday. The problem has been gradually worsening. The problem is mild. Nothing relieves the symptoms. Nothing aggravates the symptoms. Associated symptoms include mouth sores. Pertinent negatives include no fever, no decreased vision, no nausea, no congestion, no ear discharge, no ear pain, no headaches, no rhinorrhea, no sore throat, no swollen glands, no URI and no eye discharge.    Past Medical History  Diagnosis Date  . GERD (gastroesophageal reflux disease)   . UTI (lower urinary tract infection)   . Ovarian cyst   . Dyspepsia   . Benign neoplasm of eyelid including canthus     Past Surgical History  Procedure Date  . Myringotomy     x2  . Tympanostomy   . Tonsillectomy   . Nasal sinus surgery   . Laparoscopy 12/19/10  . Upper gastrointestinal endoscopy 10/17/2010    normal    Family History  Problem Relation Age of Onset  . Diabetes Mother   . Hypertension Mother   . Irritable bowel syndrome Father   . Breast cancer Maternal Grandmother   . Heart disease Maternal Grandfather   . Colon cancer Neg Hx     History  Substance Use Topics  . Smoking status: Never Smoker   . Smokeless tobacco: Never Used  . Alcohol Use: No    OB History    Grav Para Term Preterm Abortions TAB SAB Ect Mult Living                  Review of Systems  Constitutional: Negative for fever.  HENT:  Positive for mouth sores. Negative for ear pain, congestion, sore throat, rhinorrhea and ear discharge.   Eyes: Negative for discharge.  Gastrointestinal: Negative for nausea.  Neurological: Negative for headaches.  All other systems reviewed and are negative.    Allergies  Review of patient's allergies indicates no known allergies.  Home Medications   Current Outpatient Rx  Name  Route  Sig  Dispense  Refill  . AMBULATORY NON FORMULARY MEDICATION      Medication Name: Diltiazem gel 2% Apply small amount to anal area twice a day   30 g   1   . AMOXICILLIN 875 MG PO TABS   Oral   Take 1 tablet (875 mg total) by mouth 2 (two) times daily. (Rx void after 05/23/12)   20 tablet   0   . BENZONATATE 200 MG PO CAPS   Oral   Take 1 capsule (200 mg total) by mouth at bedtime. Take as needed for cough   12 capsule   0   . BUSPIRONE HCL 10 MG PO TABS      1/2 tab twice a day for 2 weeks then 1 tab twice a day   60 tablet   2   . DESOGESTREL-ETHINYL ESTRADIOL 0.15-0.02/0.01 MG (21/5) PO TABS   Oral   Take 1 tablet by mouth daily.           Marland Kitchen  MULTIVITAMINS PO CAPS   Oral   Take 1 capsule by mouth daily.           Marland Kitchen HAIR/SKIN/NAILS PO   Oral   Take 1 tablet by mouth daily.           Marland Kitchen PREDNISONE 20 MG PO TABS   Oral   Take 1 tablet (20 mg total) by mouth 2 (two) times daily. Take with food.   10 tablet   0   . VALACYCLOVIR HCL 1 G PO TABS      Take two tabs by mouth every 12 hours for one day.  Take at onset of cold sore   4 tablet   3     BP 110/76  Pulse 75  Temp 97.6 F (36.4 C) (Oral)  Resp 16  Ht 5\' 7"  (1.702 m)  Wt 137 lb 8 oz (62.37 kg)  BMI 21.54 kg/m2  LMP 07/25/2012  Physical Exam  Nursing note and vitals reviewed. Constitutional: She is oriented to person, place, and time. She appears well-developed and well-nourished. No distress.  HENT:  Head: Atraumatic.  Right Ear: Tympanic membrane and external ear normal.  Left Ear: Tympanic  membrane and external ear normal.  Nose: Nose normal.  Mouth/Throat: No oropharyngeal exudate.         Below lateral aspect of right lower lip is a tender erythematous crusted lesion about 8mm by 5mm.  Eyes: Conjunctivae normal are normal. Pupils are equal, round, and reactive to light.  Neck: Neck supple.  Cardiovascular: Normal heart sounds.   Pulmonary/Chest: Breath sounds normal.  Lymphadenopathy:    She has no cervical adenopathy.  Neurological: She is alert and oriented to person, place, and time.  Skin: Skin is warm and dry.    .   ED Course  Procedures  none      1. Herpes stomatitis       MDM  Begin Valtrex. Followup with Family Doctor if not improved in 5 days        Lattie Haw, MD 07/30/12 [phone]

## 2012-08-22 ENCOUNTER — Other Ambulatory Visit: Payer: Self-pay | Admitting: Family Medicine

## 2012-08-22 MED ORDER — VALACYCLOVIR HCL 1 G PO TABS: ORAL_TABLET | ORAL | Status: DC

## 2012-08-22 NOTE — Telephone Encounter (Signed)
mom called states pt needs a Valtrex rx sent to [facility] cb# [phone]

## 2012-08-23 ENCOUNTER — Other Ambulatory Visit: Payer: Self-pay

## 2012-08-23 MED ORDER — VALACYCLOVIR HCL 1 G PO TABS: ORAL_TABLET | ORAL | Status: DC

## 2012-08-26 ENCOUNTER — Other Ambulatory Visit: Payer: Self-pay | Admitting: *Deleted

## 2012-08-26 DIAGNOSIS — B001 Herpesviral vesicular dermatitis: Secondary | ICD-10-CM

## 2012-08-26 MED ORDER — VALACYCLOVIR HCL 1 G PO TABS: ORAL_TABLET | ORAL | Status: DC

## 2012-08-26 NOTE — Telephone Encounter (Signed)
Refill for Valtrex sent to [facility]

## 2012-09-26 MED ORDER — FLUCONAZOLE 150 MG PO TABS: 150.0000 mg | ORAL_TABLET | Freq: Once | ORAL | Status: DC

## 2012-11-04 ENCOUNTER — Encounter (HOSPITAL_COMMUNITY): Payer: Self-pay | Admitting: *Deleted

## 2012-11-04 ENCOUNTER — Emergency Department (HOSPITAL_COMMUNITY)
Admission: EM | Admit: 2012-11-04 | Discharge: 2012-11-04 | Disposition: A | Discharge status: Home / Self Care | Payer: 59 | Source: Home / Self Care | Attending: Family Medicine | Admitting: Family Medicine

## 2012-11-04 ENCOUNTER — Emergency Department (INDEPENDENT_AMBULATORY_CARE_PROVIDER_SITE_OTHER): Payer: 59

## 2012-11-04 DIAGNOSIS — S92912B Unspecified fracture of left toe(s), initial encounter for open fracture: Secondary | ICD-10-CM

## 2012-11-04 DIAGNOSIS — S92919B Unspecified fracture of unspecified toe(s), initial encounter for open fracture: Secondary | ICD-10-CM

## 2012-11-04 HISTORY — DX: Herpesviral infection, unspecified: B00.9

## 2012-11-04 HISTORY — DX: Candidiasis, unspecified: B37.9

## 2012-11-04 MED ORDER — IBUPROFEN 800 MG PO TABS
ORAL_TABLET | ORAL | Status: AC
  Filled 2012-11-04: qty 1

## 2012-11-04 MED ORDER — IBUPROFEN 800 MG PO TABS
800.0000 mg | ORAL_TABLET | Freq: Once | ORAL | Status: AC
  Administered 2012-11-04: 800 mg via ORAL

## 2012-11-04 NOTE — ED Provider Notes (Signed)
History     CSN: [id number]  Arrival date & time 11/04/12  4132   First MD Initiated Contact with Patient 11/04/12 2037      Chief Complaint  Patient presents with  . Foot Pain    (Consider location/radiation/quality/duration/timing/severity/associated sxs/prior treatment) HPI Comments: Pt dropped 20 pound weight on L foot this evening.   Patient is a 22 y.o. female presenting with lower extremity pain. The history is provided by the patient.  Foot Pain This is a new problem. The current episode started 3 to 5 hours ago. The problem occurs constantly. The problem has not changed since onset.The symptoms are aggravated by standing and walking. Nothing relieves the symptoms. She has tried nothing for the symptoms.    Past Medical History  Diagnosis Date  . GERD (gastroesophageal reflux disease)   . UTI (lower urinary tract infection)   . Ovarian cyst   . Dyspepsia   . Benign neoplasm of eyelid including canthus   . Yeast infection   . Herpes simplex type 1 antibody positive     Past Surgical History  Procedure Laterality Date  . Myringotomy      x2  . Tympanostomy    . Tonsillectomy    . Nasal sinus surgery    . Laparoscopy  12/19/10  . Upper gastrointestinal endoscopy  10/17/2010    normal  . Tympanoplasty  2000    Family History  Problem Relation Age of Onset  . Diabetes Mother   . Hypertension Mother   . Irritable bowel syndrome Father   . Breast cancer Maternal Grandmother   . Heart disease Maternal Grandfather   . Colon cancer Neg Hx     History  Substance Use Topics  . Smoking status: Never Smoker   . Smokeless tobacco: Never Used  . Alcohol Use: No    OB History   Grav Para Term Preterm Abortions TAB SAB Ect Mult Living                  Review of Systems  Musculoskeletal:       L Foot pain after dropping heavy weight on it  Skin: Positive for color change.  Neurological: Negative for numbness.    Allergies  Review of patient's allergies  indicates no known allergies.  Home Medications   Current Outpatient Rx  Name  Route  Sig  Dispense  Refill  . busPIRone (BUSPAR) 10 MG tablet      1/2 tab twice a day for 2 weeks then 1 tab twice a day   60 tablet   2   . desogestrel-ethinyl estradiol (KARIVA,AZURETTE,MIRCETTE) 0.15-0.02/0.01 MG (21/5) tablet   Oral   Take 1 tablet by mouth daily.           . Multiple Vitamin (MULTIVITAMIN) capsule   Oral   Take 1 capsule by mouth daily.           . Multiple Vitamins-Minerals (HAIR/SKIN/NAILS PO)   Oral   Take 1 tablet by mouth daily.           . valACYclovir (VALTREX) 1000 MG tablet      Take two tabs by mouth every 12 hours for one day.  Take at onset of cold sore   4 tablet   3   . AMBULATORY NON FORMULARY MEDICATION      Medication Name: Diltiazem gel 2% Apply small amount to anal area twice a day   30 g   1   . amoxicillin (AMOXIL)  875 MG tablet   Oral   Take 1 tablet (875 mg total) by mouth 2 (two) times daily. (Rx void after 05/23/12)   20 tablet   0   . benzonatate (TESSALON) 200 MG capsule   Oral   Take 1 capsule (200 mg total) by mouth at bedtime. Take as needed for cough   12 capsule   0   . fluconazole (DIFLUCAN) 150 MG tablet   Oral   Take 1 tablet (150 mg total) by mouth once. Repeat if needed   2 tablet   0   . predniSONE (DELTASONE) 20 MG tablet   Oral   Take 1 tablet (20 mg total) by mouth 2 (two) times daily. Take with food.   10 tablet   0     BP 135/87  Pulse 89  Temp(Src) 99.1 F (37.3 C) (Oral)  Resp 22  SpO2 97%  LMP 11/04/2012  Physical Exam  Constitutional: She appears well-developed and well-nourished. No distress.  Cardiovascular:  Pulses:      Dorsalis pedis pulses are 2+ on the left side.  Musculoskeletal:       Left foot: She exhibits tenderness, bony tenderness and swelling.       Feet:  Skin: Skin is warm and dry. Bruising noted.  Bruising L 3rd toe    ED Course  Procedures (including critical  care time)  Labs Reviewed - No data to display Dg Foot Complete Left  11/04/2012  *RADIOLOGY REPORT*  Clinical Data: Crush injury of the left foot, pain  LEFT FOOT - COMPLETE 3+ VIEW  Comparison: None.  Findings: No acute fracture is seen.  Alignment is normal.  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  Clinical correlation is recommended.  IMPRESSION: No definite acute fracture.   Original Report Authenticated By: Dwyane Dee, M.D.      1. Toe fracture, left, open, initial encounter       MDM  Presumptively fractured toe given location of pain and lucency on x-ray. Buddy tape, crutches, post op shoe.  Pt has own ortho, Dr. Charlann Boxer, and will f/u with him.         Cathlyn Parsons, NP 11/04/12 2109

## 2012-11-04 NOTE — ED Notes (Signed)
Mom states 25 lb. wt. fell off shelf onto left foot today at gym at [employer]at 1730. Pt. Thinks she broke her foot. L third toe is bruised and deformed.

## 2012-11-06 NOTE — ED Provider Notes (Signed)
Medical screening examination/treatment/procedure(s) were performed by resident physician or non-physician practitioner and as supervising physician I was immediately available for consultation/collaboration.   KINDL,JAMES DOUGLAS MD.   James D Kindl, MD 11/06/12 1527 

## 2012-12-20 ENCOUNTER — Telehealth: Payer: Self-pay | Admitting: Family Medicine

## 2012-12-20 DIAGNOSIS — B001 Herpesviral vesicular dermatitis: Secondary | ICD-10-CM

## 2012-12-20 MED ORDER — VALACYCLOVIR HCL 1 G PO TABS: ORAL_TABLET | ORAL | Status: DC

## 2012-12-20 NOTE — Telephone Encounter (Signed)
Med filled.  

## 2012-12-20 NOTE — Telephone Encounter (Signed)
Please advise 

## 2012-12-20 NOTE — Telephone Encounter (Signed)
[place] the beach [place], and normally has bad break outs of fever blisters when there.  Is requesting a Rx for the VALTREX 1000MG  be sent by today to [facility].

## 2012-12-20 NOTE — Telephone Encounter (Signed)
Ok to refill x1  3 refills 

## 2012-12-20 NOTE — Telephone Encounter (Signed)
Pt calling back in reference to the refill of Valtrex 1000 mg.

## 2012-12-31 ENCOUNTER — Telehealth: Payer: Self-pay | Admitting: General Practice

## 2012-12-31 NOTE — Telephone Encounter (Signed)
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  Please call her at [phone].

## 2012-12-31 NOTE — Telephone Encounter (Signed)
Patient aware immunization left at check in.    KP

## 2013-01-08 ENCOUNTER — Ambulatory Visit (INDEPENDENT_AMBULATORY_CARE_PROVIDER_SITE_OTHER): Payer: 59 | Admitting: *Deleted

## 2013-01-08 DIAGNOSIS — Z23 Encounter for immunization: Secondary | ICD-10-CM

## 2013-01-20 ENCOUNTER — Telehealth: Payer: Self-pay | Admitting: Internal Medicine

## 2013-01-20 NOTE — Telephone Encounter (Signed)
Spoke with the patient's mother.  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  She reports bloating, constipation, and abdominal pain.  She had stopped the Buspar some time ago.    She has no signs of rectal fissure at this time.  She started back on Buspar on Saturday.  She has not had BM in 2 weeks.  Crystal [name] will have her try Miralax BID.  She wants to keep the appt for 02/25/13.  She will call back for appt with extender if anything changes she did not want to schedule with the extender at this time

## 2013-01-21 ENCOUNTER — Other Ambulatory Visit: Payer: Self-pay | Admitting: Internal Medicine

## 2013-01-21 NOTE — Telephone Encounter (Signed)
Ok to refill x 2 months and will need rev

## 2013-01-21 NOTE — Telephone Encounter (Signed)
Per Dr. Leone Payor ok to send in refills

## 2013-01-21 NOTE — Telephone Encounter (Signed)
Please advise Sir, thank you. 

## 2013-02-25 ENCOUNTER — Ambulatory Visit (INDEPENDENT_AMBULATORY_CARE_PROVIDER_SITE_OTHER): Payer: 59 | Admitting: Internal Medicine

## 2013-02-25 ENCOUNTER — Encounter: Payer: Self-pay | Admitting: Internal Medicine

## 2013-02-25 ENCOUNTER — Telehealth: Payer: Self-pay | Admitting: Internal Medicine

## 2013-02-25 VITALS — BP 104/72 | HR 80 | Ht 66.0 in | Wt 134.0 lb

## 2013-02-25 DIAGNOSIS — K3189 Other diseases of stomach and duodenum: Secondary | ICD-10-CM

## 2013-02-25 DIAGNOSIS — K589 Irritable bowel syndrome without diarrhea: Secondary | ICD-10-CM | POA: Insufficient documentation

## 2013-02-25 DIAGNOSIS — R1013 Epigastric pain: Secondary | ICD-10-CM

## 2013-02-25 DIAGNOSIS — K30 Functional dyspepsia: Secondary | ICD-10-CM

## 2013-02-25 DIAGNOSIS — K648 Other hemorrhoids: Secondary | ICD-10-CM

## 2013-02-25 MED ORDER — SOD PICOSULFATE-MAG OX-CIT ACD 10-3.5-12 MG-GM-GM PO PACK: 1.0000 | PACK | Freq: Once | ORAL | Status: DC

## 2013-02-25 MED ORDER — PANTOPRAZOLE SODIUM 40 MG PO TBEC: 40.0000 mg | DELAYED_RELEASE_TABLET | Freq: Every day | ORAL | Status: DC

## 2013-02-25 MED ORDER — LINACLOTIDE 290 MCG PO CAPS: 1.0000 | ORAL_CAPSULE | Freq: Every day | ORAL | Status: DC

## 2013-02-25 NOTE — Progress Notes (Signed)
  Subjective:    Patient ID: Crystal [name], female    DOB: [birth year], 22 y.o.   MRN: [id number]  HPI Ziyanna is here with her dad. She has been helped in the past by treating functional dyspepsia with buspirone and remains on that. Anal fissure has been treated with diltiazem gel also. She has been having difficulty with constipation with no defecation for a week or more. She has had a painful anal protrusion but no bleeding and does not have anal pain with defecation like she did with fissure.  Also having epigastric pain, described as a twisting pain occurring after meals at times and some occasional heartburn sxs. She is not on acid suppression at this time. She has been working out and using amino acids and protein powder but not lifting free weights. She remains in nursing school.  Wt Readings from Last 3 Encounters:  02/25/13 134 lb (60.782 kg)  07/27/12 137 lb 8 oz (62.37 kg)  05/15/12 135 lb (61.236 kg)    Review of Systems As above    Objective:   Physical Exam WDWN young white woman NAD Abdomen is soft and non-tender Rectal exam with female staff present shows a violaceous color change in the LL position but otherwise normal anoderm. Digital exam is without mass and no mass and normal rectal tone. Brown stool present. No rectocele. Appropriate voluntary squeeze and relaxation and descent with Valsalva.  Anoscopy shows a mildly prominent right posterior internal hemorrhoid pile but is otherwise normal, Lab Results  Component Value Date   TSH 1.604 01/26/2012       Assessment & Plan:  IBS (irritable bowel syndrome)  Abdominal pain, epigastric - Plan: pantoprazole (PROTONIX) 40 MG tablet  Functional dyspepsia  Internal hemorrhoids with other complication  1. Will have her purge with half of a Prep-o-Pik prep 2. Add PPI 3. Linzess 290 ug daily to promote more regular defecation 4. REV about 6 weeks, call back sooner if not improving 5. If ok when she returns and  bowels moving better anticipate trying to withdraw PPI

## 2013-02-25 NOTE — Patient Instructions (Addendum)
Today we are giving you a prepopik to use 1/2 of the kit to purge your bowels.  Follow the directions on the box.  Today we are giving you samples of Linzess to take one a day on an empty stomach for constipation.  Start the samples after you purge your bowels.  If the samples help and you need an rx call us back and we will send it in.  We have sent the following medications to your pharmacy for you to pick up at your convenience: Pantoprazole   Follow up in 6 weeks.  If no better in next few days call us back.   I appreciate the opportunity to care for you.

## 2013-02-25 NOTE — Telephone Encounter (Signed)
FYI

## 2013-03-06 ENCOUNTER — Other Ambulatory Visit: Payer: Self-pay | Admitting: Family Medicine

## 2013-03-10 ENCOUNTER — Telehealth: Payer: Self-pay | Admitting: Family Medicine

## 2013-03-10 ENCOUNTER — Telehealth: Payer: Self-pay | Admitting: Internal Medicine

## 2013-03-10 ENCOUNTER — Emergency Department (HOSPITAL_BASED_OUTPATIENT_CLINIC_OR_DEPARTMENT_OTHER)
Admission: EM | Admit: 2013-03-10 | Discharge: 2013-03-10 | Disposition: A | Discharge status: Home / Self Care | Payer: 59 | Attending: Emergency Medicine | Admitting: Emergency Medicine

## 2013-03-10 ENCOUNTER — Encounter (HOSPITAL_BASED_OUTPATIENT_CLINIC_OR_DEPARTMENT_OTHER): Payer: Self-pay | Admitting: *Deleted

## 2013-03-10 DIAGNOSIS — Z8719 Personal history of other diseases of the digestive system: Secondary | ICD-10-CM | POA: Insufficient documentation

## 2013-03-10 DIAGNOSIS — R197 Diarrhea, unspecified: Secondary | ICD-10-CM | POA: Insufficient documentation

## 2013-03-10 DIAGNOSIS — K589 Irritable bowel syndrome without diarrhea: Secondary | ICD-10-CM | POA: Insufficient documentation

## 2013-03-10 DIAGNOSIS — K5900 Constipation, unspecified: Secondary | ICD-10-CM | POA: Insufficient documentation

## 2013-03-10 DIAGNOSIS — B009 Herpesviral infection, unspecified: Secondary | ICD-10-CM | POA: Insufficient documentation

## 2013-03-10 DIAGNOSIS — Z8619 Personal history of other infectious and parasitic diseases: Secondary | ICD-10-CM | POA: Insufficient documentation

## 2013-03-10 DIAGNOSIS — Z8669 Personal history of other diseases of the nervous system and sense organs: Secondary | ICD-10-CM | POA: Insufficient documentation

## 2013-03-10 DIAGNOSIS — Z79899 Other long term (current) drug therapy: Secondary | ICD-10-CM | POA: Insufficient documentation

## 2013-03-10 DIAGNOSIS — Z87440 Personal history of urinary (tract) infections: Secondary | ICD-10-CM | POA: Insufficient documentation

## 2013-03-10 DIAGNOSIS — R1084 Generalized abdominal pain: Secondary | ICD-10-CM | POA: Insufficient documentation

## 2013-03-10 DIAGNOSIS — Z9889 Other specified postprocedural states: Secondary | ICD-10-CM | POA: Insufficient documentation

## 2013-03-10 DIAGNOSIS — Z3202 Encounter for pregnancy test, result negative: Secondary | ICD-10-CM | POA: Insufficient documentation

## 2013-03-10 DIAGNOSIS — K219 Gastro-esophageal reflux disease without esophagitis: Secondary | ICD-10-CM | POA: Insufficient documentation

## 2013-03-10 DIAGNOSIS — Z8742 Personal history of other diseases of the female genital tract: Secondary | ICD-10-CM | POA: Insufficient documentation

## 2013-03-10 DIAGNOSIS — R1110 Vomiting, unspecified: Secondary | ICD-10-CM | POA: Insufficient documentation

## 2013-03-10 HISTORY — DX: Irritable bowel syndrome, unspecified: K58.9

## 2013-03-10 LAB — URINALYSIS, ROUTINE W REFLEX MICROSCOPIC
Bilirubin Urine: NEGATIVE
Glucose, UA: NEGATIVE mg/dL
Ketones, ur: NEGATIVE mg/dL
Leukocytes, UA: NEGATIVE
Nitrite: NEGATIVE
Protein, ur: NEGATIVE mg/dL
Specific Gravity, Urine: 1.014 (ref 1.005–1.030)
Urobilinogen, UA: 0.2 mg/dL (ref 0.0–1.0)
pH: 8.5 — ABNORMAL HIGH (ref 5.0–8.0)

## 2013-03-10 LAB — URINE MICROSCOPIC-ADD ON

## 2013-03-10 LAB — BASIC METABOLIC PANEL
BUN: 16 mg/dL (ref 6–23)
CO2: 26 mEq/L (ref 19–32)
Calcium: 10.2 mg/dL (ref 8.4–10.5)
Chloride: 100 mEq/L (ref 96–112)
Creatinine, Ser: 0.9 mg/dL (ref 0.50–1.10)
GFR calc Af Amer: >90 mL/min (ref >90)
GFR calc non Af Amer: >90 mL/min (ref >90)
Glucose, Bld: 125 mg/dL — ABNORMAL HIGH (ref 70–99)
Potassium: 3.8 mEq/L (ref 3.5–5.1)
Sodium: 138 mEq/L (ref 135–145)

## 2013-03-10 LAB — PREGNANCY, URINE: Preg Test, Ur: NEGATIVE

## 2013-03-10 MED ORDER — SODIUM CHLORIDE 0.9 % IV BOLUS (SEPSIS)
1000.0000 mL | Freq: Once | INTRAVENOUS | Status: AC
  Administered 2013-03-10: 1000 mL via INTRAVENOUS

## 2013-03-10 MED ORDER — ONDANSETRON HCL 4 MG/2ML IJ SOLN
4.0000 mg | Freq: Once | INTRAMUSCULAR | Status: AC
  Administered 2013-03-10: 4 mg via INTRAVENOUS
  Filled 2013-03-10: qty 2

## 2013-03-10 MED ORDER — MORPHINE SULFATE 4 MG/ML IJ SOLN
4.0000 mg | Freq: Once | INTRAMUSCULAR | Status: AC
  Administered 2013-03-10: 4 mg via INTRAVENOUS
  Filled 2013-03-10: qty 1

## 2013-03-10 MED ORDER — ONDANSETRON 4 MG PO TBDP: 4.0000 mg | ORAL_TABLET | Freq: Three times a day (TID) | ORAL | Status: DC | PRN

## 2013-03-10 MED ORDER — VALACYCLOVIR HCL 1 G PO TABS: 1000.0000 mg | ORAL_TABLET | Freq: Every day | ORAL | Status: DC

## 2013-03-10 NOTE — Telephone Encounter (Signed)
Valtrex 1 g 1 po qd  #30  11 refills

## 2013-03-10 NOTE — Telephone Encounter (Signed)
Rx faxed.    KP 

## 2013-03-10 NOTE — Telephone Encounter (Signed)
Patient's Mom is calling to request a 30-day supply on the patient's Valtrex medication. 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 Mom says that her only being able to get four at a time is not convenient for her at this time.

## 2013-03-10 NOTE — Telephone Encounter (Signed)
Patient in the ER now at [facility].  She states you wanted to know if her pain has not improved.

## 2013-03-10 NOTE — ED Notes (Signed)
Pt amb to room 6 with quick steady gait in nad. Pt reports 3 years of intermittent abd pain, 2 weeks of diarrhea, vomiting x 3 today.

## 2013-03-10 NOTE — Telephone Encounter (Signed)
Please advise on new directions and quantity      KP

## 2013-03-10 NOTE — ED Notes (Signed)
Pt given 240cc ginger ale for po trial.  

## 2013-03-10 NOTE — ED Provider Notes (Signed)
CSN: [id number]     Arrival date & time 03/10/13  0901 History     First MD Initiated Contact with Patient 03/10/13 [phone]     Chief Complaint  Patient presents with  . Emesis   (Consider location/radiation/quality/duration/timing/severity/associated sxs/prior Treatment) HPI Comments: Pt states that she has an ongoing problem of constipation and diarrhea:pt states that she was started on a new med by gi 2 weeks ago and has had diarrhea since:pt states that in the last couple of days she has had generalized abdominal pain:pt states that this morning she has had multiple episodes of vomiting:pt denies fevers  The history is provided by the patient.    Past Medical History  Diagnosis Date  . GERD (gastroesophageal reflux disease)   . UTI (lower urinary tract infection)   . Ovarian cyst   . Dyspepsia   . Benign neoplasm of eyelid including canthus   . Yeast infection   . Herpes simplex type 1 antibody positive   . Irritable bowel syndrome    Past Surgical History  Procedure Laterality Date  . Myringotomy      x2  . Tympanostomy    . Tonsillectomy    . Nasal sinus surgery    . Laparoscopy  12/19/10  . Upper gastrointestinal endoscopy  10/17/2010    normal  . Tympanoplasty  2000   Family History  Problem Relation Age of Onset  . Diabetes Mother   . Hypertension Mother   . Irritable bowel syndrome Father   . Breast cancer Maternal Grandmother   . Heart disease Maternal Grandfather   . Colon cancer Neg Hx   . Brain cancer Paternal Grandmother     tumor   History  Substance Use Topics  . Smoking status: Never Smoker   . Smokeless tobacco: Never Used  . Alcohol Use: No   OB History   Grav Para Term Preterm Abortions TAB SAB Ect Mult Living                 Review of Systems  Constitutional: Negative.   Respiratory: Negative.   Cardiovascular: Negative.     Allergies  Review of patient's allergies indicates no known allergies.  Home Medications   Current  Outpatient Rx  Name  Route  Sig  Dispense  Refill  . busPIRone (BUSPAR) 10 MG tablet      TAKE 1/2 TABLET BY MOUTH TWICE A DAY FOR 2 WEEKS THEN 1 TABLET BY MOUTH TWICE A DAY THEREAFTER   60 tablet   1   . desogestrel-ethinyl estradiol (KARIVA,AZURETTE,MIRCETTE) 0.15-0.02/0.01 MG (21/5) tablet   Oral   Take 1 tablet by mouth daily.           Marland Kitchen Linaclotide (LINZESS) 290 MCG CAPS   Oral   Take 1 capsule by mouth daily.   40 capsule   0     Samples given to patient    Lot# [id number]           .Marland Kitchen.   . Multiple Vitamin (MULTIVITAMIN) capsule   Oral   Take 1 capsule by mouth daily.           . Multiple Vitamins-Minerals (HAIR/SKIN/NAILS PO)   Oral   Take 1 tablet by mouth daily.           . pantoprazole (PROTONIX) 40 MG tablet   Oral   Take 1 tablet (40 mg total) by mouth daily before breakfast.   30 tablet   2   . Sod Picosulfate-Mag  Ox-Cit Acd (PREPOPIK) 10-3.5-12 MG-GM-GM PACK   Oral   Take 1 kit by mouth once.   1 each   0     Sample given to patient to use 1/2 kit to purge bo ...   . valACYclovir (VALTREX) 1000 MG tablet      TAKE 2 TABLETS BY MOUTH EVERY 12 HOURS FOR 1 DAYS. TAKE AT THE ONSET OF COLD SORE   4 tablet   3    BP 125/80  Pulse 60  Temp(Src) 97.9 F (36.6 C) (Oral)  Resp 18  SpO2 100%  LMP 02/25/2013 Physical Exam  Nursing note and vitals reviewed. Constitutional: She appears well-developed and well-nourished.  HENT:  Head: Normocephalic and atraumatic.  Eyes: Conjunctivae and EOM are normal.  Neck: Normal range of motion.  Cardiovascular: Normal rate and regular rhythm.   Pulmonary/Chest: Effort normal and breath sounds normal.  Abdominal: Soft. Bowel sounds are increased. There is generalized tenderness.  Musculoskeletal: Normal range of motion.  Neurological: She is alert.  Skin: Skin is warm and dry.  Psychiatric: She has a normal mood and affect.    ED Course   Procedures (including critical care time)  Labs Reviewed   URINALYSIS, ROUTINE W REFLEX MICROSCOPIC - Abnormal; Notable for the following:    pH 8.5 (*)    Hgb urine dipstick TRACE (*)    All other components within normal limits  BASIC METABOLIC PANEL - Abnormal; Notable for the following:    Glucose, Bld 125 (*)    All other components within normal limits  URINE MICROSCOPIC-ADD ON - Abnormal; Notable for the following:    Bacteria, UA FEW (*)    All other components within normal limits  URINE CULTURE  PREGNANCY, URINE   No results found. 1. Vomiting and diarrhea     MDM  Spoke with Dr. Leone Payor who said to stop the medication:pt is feeling better at this time and he is going to contact the pt for follow up and medication changes  Teressa Lower, NP 03/10/13 1122

## 2013-03-10 NOTE — Telephone Encounter (Signed)
I advised NP in ED to stop Linzess - having multiple diarrheal stools each day  We will call her back to see how she is in next 1-2 days and arrange a follow-up/consider trying lower dose of Linzess other meds for pain (please call her tomorrow and see how she is and arrange follow-up for next week, sooner if needed)

## 2013-03-11 LAB — URINE CULTURE
Colony Count: NO GROWTH
Culture: NO GROWTH

## 2013-03-11 NOTE — Telephone Encounter (Signed)
Patient is "doing much better" She is scheduled for follow up for 03/18/13 2:15 She will call back for problems prior to the appt

## 2013-03-13 NOTE — ED Provider Notes (Signed)
Medical screening examination/treatment/procedure(s) were performed by non-physician practitioner and as supervising physician I was immediately available for consultation/collaboration.   Nelia Shi, MD 03/13/13 [phone]

## 2013-03-18 ENCOUNTER — Emergency Department
Admission: EM | Admit: 2013-03-18 | Discharge: 2013-03-18 | Disposition: A | Discharge status: Home / Self Care | Payer: 59 | Source: Home / Self Care | Attending: Emergency Medicine | Admitting: Emergency Medicine

## 2013-03-18 ENCOUNTER — Encounter: Payer: Self-pay | Admitting: *Deleted

## 2013-03-18 ENCOUNTER — Encounter: Payer: Self-pay | Admitting: Internal Medicine

## 2013-03-18 ENCOUNTER — Ambulatory Visit (INDEPENDENT_AMBULATORY_CARE_PROVIDER_SITE_OTHER): Payer: 59 | Admitting: Internal Medicine

## 2013-03-18 VITALS — BP 100/60 | HR 73 | Ht 65.25 in | Wt 134.8 lb

## 2013-03-18 DIAGNOSIS — B373 Candidiasis of vulva and vagina: Secondary | ICD-10-CM

## 2013-03-18 DIAGNOSIS — K3189 Other diseases of stomach and duodenum: Secondary | ICD-10-CM

## 2013-03-18 DIAGNOSIS — K30 Functional dyspepsia: Secondary | ICD-10-CM

## 2013-03-18 DIAGNOSIS — K589 Irritable bowel syndrome without diarrhea: Secondary | ICD-10-CM

## 2013-03-18 MED ORDER — LINACLOTIDE 145 MCG PO CAPS: 145.0000 ug | ORAL_CAPSULE | ORAL | Status: DC

## 2013-03-18 MED ORDER — FLUCONAZOLE 200 MG PO TABS: 200.0000 mg | ORAL_TABLET | Freq: Every day | ORAL | Status: DC

## 2013-03-18 MED ORDER — BUSPIRONE HCL 10 MG PO TABS: 10.0000 mg | ORAL_TABLET | Freq: Two times a day (BID) | ORAL | Status: DC

## 2013-03-18 NOTE — Assessment & Plan Note (Signed)
Linzess 290 ug too effective Try 145 ug qod and increase to qd if needed

## 2013-03-18 NOTE — ED Provider Notes (Signed)
CSN: [id number]     Arrival date & time 03/18/13  1104 History     First MD Initiated Contact with Patient 03/18/13 1144     Chief Complaint  Patient presents with  . Vaginal Discharge    Patient is a 22 y.o. female presenting with vaginal discharge. The history is provided by the patient.  Vaginal Discharge Quality:  White (cottage cheese consistency) Severity:  Moderate Onset quality:  Unable to specify Duration:  1 month Timing:  Unable to specify Progression:  Unchanged Chronicity:  New Context: not after urination   Ineffective treatments: OTC Monistat cream. Associated symptoms: vaginal itching   Associated symptoms: no abdominal pain, no dysuria, no fever, no genital lesions, no nausea, no rash, no urinary frequency and no vomiting   Risk factors: no immunosuppression   Risk factors comment:  She denies ever having had sexual intercourse.. was seen at [facility] emergency room for an unrelated problem 03/10/13, at which time urine pregnancy test negative.  Past medical history: see below. Of note, she has a history of the oral cold sores, treated with PRN Valtrex, not an active problem now. She has never been sexually active, and has never had any genital lesions. She is being prescribed OCP's by her GYN for ovarian cyst and for irregular periods.  Past Medical History  Diagnosis Date  . GERD (gastroesophageal reflux disease)   . UTI (lower urinary tract infection)   . Ovarian cyst   . Dyspepsia   . Benign neoplasm of eyelid including canthus   . Yeast infection   . Herpes simplex type 1 antibody positive   . Irritable bowel syndrome    Past Surgical History  Procedure Laterality Date  . Myringotomy      x2  . Tympanostomy    . Tonsillectomy    . Nasal sinus surgery    . Laparoscopy  12/19/10  . Upper gastrointestinal endoscopy  10/17/2010    normal  . Tympanoplasty  2000   Family History  Problem Relation Age of Onset  . Diabetes Mother   .  Hypertension Mother   . Irritable bowel syndrome Father   . Breast cancer Maternal Grandmother   . Heart disease Maternal Grandfather   . Colon cancer Neg Hx   . Brain cancer Paternal Grandmother     tumor   History  Substance Use Topics  . Smoking status: Never Smoker   . Smokeless tobacco: Never Used  . Alcohol Use: No   OB History   Grav Para Term Preterm Abortions TAB SAB Ect Mult Living                 Review of Systems  Constitutional: Negative for fever.  Gastrointestinal: Negative for nausea, vomiting and abdominal pain.  Genitourinary: Positive for vaginal discharge. Negative for dysuria.  All other systems reviewed and are negative.    Allergies  Review of patient's allergies indicates no known allergies.  Home Medications   Current Outpatient Rx  Name  Route  Sig  Dispense  Refill  . busPIRone (BUSPAR) 10 MG tablet   Oral   Take 1 tablet (10 mg total) by mouth 2 (two) times daily.   60 tablet   1   . desogestrel-ethinyl estradiol (KARIVA,AZURETTE,MIRCETTE) 0.15-0.02/0.01 MG (21/5) tablet   Oral   Take 1 tablet by mouth daily.           . fluconazole (DIFLUCAN) 200 MG tablet   Oral   Take 1 tablet (200 mg  total) by mouth daily. For 3 days   3 tablet   0   . Linaclotide (LINZESS) 145 MCG CAPS capsule   Oral   Take 1 capsule (145 mcg total) by mouth every other day.   32 capsule   0     Samples of this drug were given to the patient, qu ...   . Multiple Vitamin (MULTIVITAMIN) capsule   Oral   Take 1 capsule by mouth daily.           . Multiple Vitamins-Minerals (HAIR/SKIN/NAILS PO)   Oral   Take 1 tablet by mouth daily.           . ondansetron (ZOFRAN ODT) 4 MG disintegrating tablet   Oral   Take 1 tablet (4 mg total) by mouth every 8 (eight) hours as needed for nausea.   20 tablet   0   . pantoprazole (PROTONIX) 40 MG tablet   Oral   Take 1 tablet (40 mg total) by mouth daily before breakfast.   30 tablet   2   .  valACYclovir (VALTREX) 1000 MG tablet   Oral   Take 1 tablet (1,000 mg total) by mouth daily.   30 tablet   11    BP 102/67  Pulse 79  Temp(Src) 98 F (36.7 C) (Oral)  Resp 18  Ht 5\' 7"  (1.702 m)  Wt 134 lb (60.782 kg)  BMI 20.98 kg/m2  SpO2 98%  LMP 03/04/2013 Physical Exam  Nursing note and vitals reviewed. Constitutional: She is oriented to person, place, and time. She appears well-developed and well-nourished. No distress.  HENT:  Head: Normocephalic and atraumatic.  Eyes: Conjunctivae and EOM are normal. Pupils are equal, round, and reactive to light. No scleral icterus.  Neck: Normal range of motion.  Cardiovascular: Normal rate.   Pulmonary/Chest: Effort normal.  Abdominal: She exhibits no distension.  Musculoskeletal: Normal range of motion.  Neurological: She is alert and oriented to person, place, and time.  Skin: Skin is warm.  Psychiatric: She has a normal mood and affect.   I offered to do gyn exam to evaluate further, but she declined.  I offered to have her do self-swap so we can evaluate discharge, but she declined, and she stated she prefers to wait until her regular GYN exam/Pap smear scheduled with her gynecologist next month. ED Course   Procedures (including critical care time)  Labs Reviewed - No data to display No results found. 1. Vaginal yeast infection     MDM  Over 20 min. visit today, more than half for discussion. She prefers empiric treatment.--she declined any further evaluation today. After risk, benefits, alternative discussed, she agrees with the following plans: RX: Diflucan 200 mg PO daily times three days. Follow-up with GYN if no better within one week, sooner if worse or new symptoms. Red flags discussed. She voiced understanding and agreement with above plans.  Lajean Manes, MD 03/18/13 [phone]

## 2013-03-18 NOTE — ED Notes (Signed)
Pt c/o white vaginal d/c, vaginal itching and burning x . She has used OTC monistat with no relief.

## 2013-03-18 NOTE — Progress Notes (Signed)
  Subjective:    Patient ID: Autumn Patty, female    DOB: 1991/05/10, 22 y.o.   MRN: [id number]  HPI Athena is here for follow-up after an ED visit - nausea and vomiting and abdominal pain. She was started on Linzess 290 ug about 1 week before that. She used a MiraLAx purge and started Tx. Was moving bowels but several loose stools/day over a few hrs. Then about 5 days ago had N/V and abdominal pain. Better in 24 hrs - labs ok. Told to hold Linzess and did.  Feels ok today but getting constipated again. She says home life stressful w/ sister (has special needs). Will get nausea intermittently, usually at bedtime, helped by ondansetron.   Review of Systems Has vaginal Candidiasis. Seen at urgent care today. Starting fluconazole    Objective:   Physical Exam WDWN NAD Mildly tender LLQ and RLQ Assessment & Plan:

## 2013-03-18 NOTE — Assessment & Plan Note (Signed)
Continue buspirone Suspect situational stressors at home have some effect

## 2013-03-18 NOTE — Patient Instructions (Addendum)
Today we are providing you with a sample of Linzess 145 mcg.  Take one every other day for constipation.  May increase to daily if needed.  Follow up with [place] in a month.   I appreciate the opportunity to care for you.

## 2013-03-24 ENCOUNTER — Telehealth: Payer: Self-pay | Admitting: Family Medicine

## 2013-03-24 NOTE — Telephone Encounter (Signed)
Pt called and wanted to schedule an appointment for her a phys and a tb but the next available appointment I have is September 25th at 2:00pm. Pt states that she has to have her phys from filled out by September 16th please advise me my next step with assisting her. Thanks

## 2013-03-24 NOTE — Telephone Encounter (Signed)
Caller: Crystal [name]/Patient; Phone: [phone]; Reason for Call: Patient is calling wanting information on last TB skin test and last physical. Reviewed Epic . It is noted that last Immunization and physical was 01/26/2012.  Caller would like to schedule another physical.  Transferred to office appt line.

## 2013-03-24 NOTE — Telephone Encounter (Signed)
Sept 12 at 3 pm.       KP

## 2013-03-25 ENCOUNTER — Ambulatory Visit (INDEPENDENT_AMBULATORY_CARE_PROVIDER_SITE_OTHER): Payer: 59 | Admitting: Family

## 2013-03-25 ENCOUNTER — Encounter: Payer: Self-pay | Admitting: Family

## 2013-03-25 VITALS — BP 104/78 | HR 66 | Temp 97.8°F | Resp 16 | Ht 66.0 in | Wt 134.0 lb

## 2013-03-25 DIAGNOSIS — Z111 Encounter for screening for respiratory tuberculosis: Secondary | ICD-10-CM

## 2013-03-25 DIAGNOSIS — Z0000 Encounter for general adult medical examination without abnormal findings: Secondary | ICD-10-CM

## 2013-03-25 DIAGNOSIS — Z23 Encounter for immunization: Secondary | ICD-10-CM

## 2013-03-25 NOTE — Progress Notes (Signed)
Subjective:    Patient ID: Crystal [name], female    DOB: 01-15-1991, 22 y.o.   MRN: [id number]  HPI  [name] is a 22 yr old female who presents today for cpx.  Patient presents today for complete physical.  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 Pap Smear: Has one this April.  Sees Dr. Perlie [name]  Told that she needs a tb test, varicella and cpx.   She reports that she had chicken pox in 2003.    Review of Systems  Constitutional: Negative for unexpected weight change.  HENT: Negative for congestion.   Respiratory: Negative for cough and shortness of breath.   Cardiovascular: Negative for chest pain.  Gastrointestinal: Positive for constipation.       She reports some nausea/vomitting at times which she attributes to her ibs- now on linzess.  Now with regular daily BM  Genitourinary: Negative for dysuria, frequency and menstrual problem.  Musculoskeletal: Negative for myalgias and arthralgias.  Skin: Negative for rash.  Neurological: Negative for headaches.  Hematological: Negative for adenopathy.  Psychiatric/Behavioral:       Denies depression/anxiety   Past Medical History  Diagnosis Date  . GERD (gastroesophageal reflux disease)   . UTI (lower urinary tract infection)   . Ovarian cyst   . Dyspepsia     functional  . Benign neoplasm of eyelid including canthus   . Yeast infection   . Herpes simplex type 1 antibody positive   . Irritable bowel syndrome     History   Social History  . Marital Status: Single    Spouse Name: N/A    Number of Children: N/A  . Years of Education: N/A   Occupational History  . Not on file.   Social History Main Topics  . Smoking status: Never Smoker   . Smokeless tobacco: Never Used  . Alcohol Use: No  . Drug Use: No  . Sexual Activity: Yes    Birth Control/ Protection: Pill   Other Topics Concern  . Not on file   Social History  Narrative   Nursing student at [employer]   Lives with parents who are both [place] RN's   Works at [employer]    Past Surgical History  Procedure Laterality Date  . Myringotomy      x2  . Tympanostomy    . Tonsillectomy    . Nasal sinus surgery    . Laparoscopy  12/19/10  . Upper gastrointestinal endoscopy  10/17/2010    normal  . Tympanoplasty  2000    Family History  Problem Relation Age of Onset  . Diabetes Mother   . Hypertension Mother   . Irritable bowel syndrome Father   . Breast cancer Maternal Grandmother   . Heart disease Maternal Grandfather   . Colon cancer Neg Hx   . Brain cancer Paternal Grandmother     tumor    No Known Allergies  Current Outpatient Prescriptions on File Prior to Visit  Medication Sig Dispense Refill  . busPIRone (BUSPAR) 10 MG tablet Take 1 tablet (10 mg total) by mouth 2 (two) times daily.  60 tablet  1  . desogestrel-ethinyl estradiol (KARIVA,AZURETTE,MIRCETTE) 0.15-0.02/0.01 MG (21/5) tablet Take 1 tablet by mouth daily.        . Linaclotide (LINZESS) 145 MCG CAPS capsule Take 1 capsule (145 mcg total) by mouth every other day.  32 capsule  0  . Multiple Vitamin (  MULTIVITAMIN) capsule Take 1 capsule by mouth daily.        . Multiple Vitamins-Minerals (HAIR/SKIN/NAILS PO) Take 1 tablet by mouth daily.        . ondansetron (ZOFRAN ODT) 4 MG disintegrating tablet Take 1 tablet (4 mg total) by mouth every 8 (eight) hours as needed for nausea.  20 tablet  0  . pantoprazole (PROTONIX) 40 MG tablet Take 1 tablet (40 mg total) by mouth daily before breakfast.  30 tablet  2  . valACYclovir (VALTREX) 1000 MG tablet Take 1 tablet (1,000 mg total) by mouth daily.  30 tablet  11   No current facility-administered medications on file prior to visit.    BP 104/78  Pulse 66  Temp(Src) 97.8 F (36.6 C) (Oral)  Resp 16  Ht 5\' 6"  (1.676 m)  Wt 134 lb (60.782 kg)  BMI 21.64 kg/m2  SpO2 99%  LMP 03/04/2013        Objective:   Physical  Exam  Physical Exam  Constitutional: She is oriented to person, place, and time. She appears well-developed and well-nourished. No distress.  HENT:  Head: Normocephalic and atraumatic.  Right Ear: Tympanic membrane and ear canal normal.  Left Ear: Tympanic membrane and ear canal normal.  Mouth/Throat: Oropharynx is clear and moist.  Eyes: Pupils are equal, round, and reactive to light. No scleral icterus.  Neck: Normal range of motion. No thyromegaly present.  Cardiovascular: Normal rate and regular rhythm.   No murmur heard. Pulmonary/Chest: Effort normal and breath sounds normal. No respiratory distress. He has no wheezes. She has no rales. She exhibits no tenderness.  Abdominal: Soft. Bowel sounds are normal. He exhibits no distension and no mass. There is no tenderness. There is no rebound and no guarding.  Musculoskeletal: She exhibits no edema.  Lymphadenopathy:    She has no cervical adenopathy.  Neurological: She is alert and oriented to person, place, and time.  She exhibits normal muscle tone. Coordination normal.  Skin: Skin is warm and dry.  Psychiatric: She has a normal mood and affect. Her behavior is normal. Judgment and thought content normal.  Breast/pelvic: deferred to gyn.        Assessment & Plan:         Assessment & Plan:

## 2013-03-25 NOTE — Addendum Note (Signed)
Addended by: Mervin Kung A on: 03/25/2013 02:52 PM   Modules accepted: Orders

## 2013-03-25 NOTE — Assessment & Plan Note (Addendum)
Continue healthy diet, exercise.  Place PPD.  Check varicella titer for school.  UA reviewed from last week. Flu shot today.

## 2013-03-25 NOTE — Patient Instructions (Addendum)
Please complete your lab work prior to leaving. Follow up on Thursday for PPD reading. Follow up in 1 year for  annual physical.

## 2013-03-27 LAB — TB SKIN TEST: TB Skin Test: NEGATIVE

## 2013-03-28 ENCOUNTER — Encounter: Payer: Self-pay | Admitting: Family

## 2013-03-28 LAB — VARICELLA ZOSTER ANTIBODY, IGG: Varicella IgG: 337.8 Index — ABNORMAL HIGH (ref <135.00)

## 2013-04-01 ENCOUNTER — Telehealth: Payer: Self-pay | Admitting: Family

## 2013-04-01 NOTE — Telephone Encounter (Signed)
Patient walked in to the office requesting that we fax her last physical, immunizations, and chicken pox titer to [facility]. Fax : [phone]

## 2013-04-03 ENCOUNTER — Encounter: Payer: Self-pay | Admitting: Family

## 2013-04-08 ENCOUNTER — Ambulatory Visit: Payer: 59 | Admitting: Internal Medicine

## 2013-04-17 ENCOUNTER — Encounter: Payer: Self-pay | Admitting: Internal Medicine

## 2013-04-17 ENCOUNTER — Ambulatory Visit (INDEPENDENT_AMBULATORY_CARE_PROVIDER_SITE_OTHER): Payer: 59 | Admitting: Internal Medicine

## 2013-04-17 VITALS — BP 98/60 | HR 76 | Ht 65.0 in | Wt 137.4 lb

## 2013-04-17 DIAGNOSIS — K30 Functional dyspepsia: Secondary | ICD-10-CM

## 2013-04-17 DIAGNOSIS — K589 Irritable bowel syndrome without diarrhea: Secondary | ICD-10-CM

## 2013-04-17 DIAGNOSIS — K3189 Other diseases of stomach and duodenum: Secondary | ICD-10-CM

## 2013-04-17 MED ORDER — LINACLOTIDE 145 MCG PO CAPS: 145.0000 ug | ORAL_CAPSULE | Freq: Every day | ORAL | Status: DC

## 2013-04-17 MED ORDER — BUSPIRONE HCL 15 MG PO TABS: 15.0000 mg | ORAL_TABLET | Freq: Two times a day (BID) | ORAL | Status: DC
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
## 2013-04-17 NOTE — Assessment & Plan Note (Addendum)
Increase buspirone to 15 mg bid given stressors, etc. If she is too sleepy may adjust dosing downward. REV 6 months

## 2013-04-17 NOTE — Assessment & Plan Note (Addendum)
Improved, may need more frequent Linzess to take qod to qd See me 6 months

## 2013-04-17 NOTE — Patient Instructions (Signed)
Prescription for Linzess and Buspar was printed today for you. Savings coupon for Linzess given today.  Please follow up with Dr Leone Payor in six months

## 2013-06-12 ENCOUNTER — Emergency Department
Admission: EM | Admit: 2013-06-12 | Discharge: 2013-06-12 | Disposition: A | Discharge status: Home / Self Care | Payer: 59 | Source: Home / Self Care | Attending: Emergency Medicine | Admitting: Emergency Medicine

## 2013-06-12 ENCOUNTER — Encounter: Payer: Self-pay | Admitting: Emergency Medicine

## 2013-06-12 DIAGNOSIS — B373 Candidiasis of vulva and vagina: Secondary | ICD-10-CM

## 2013-06-12 MED ORDER — FLUCONAZOLE 200 MG PO TABS: 200.0000 mg | ORAL_TABLET | Freq: Every day | ORAL | Status: DC

## 2013-06-12 NOTE — ED Notes (Signed)
Vaginal discharge, itchy x 5 days

## 2013-06-12 NOTE — ED Provider Notes (Signed)
CSN: [id number]     Arrival date & time 06/12/13  1247 History   First MD Initiated Contact with Patient 06/12/13 1308     Chief Complaint  Patient presents with  . Vaginitis   The history is provided by the patient.  Vaginal Discharge  Quality: White (cottage cheese consistency)  Severity: Moderate  Onset quality: Unable to specify  Duration: 5 days Timing: Unable to specify  Progression: Unchanged  Chronicity: Had the same symptoms August 2014, treated with Diflucan 200 mg daily x3 days, and symptoms completely resolved  Context: not after urination  Ineffective treatments: None tried  Associated symptoms: vaginal itching  Associated symptoms: no abdominal pain, no dysuria, no fever, no genital lesions, no nausea, no rash, no urinary frequency and no vomiting. Denies any pelvic pain. Otherwise feels well.  Risk factors: no immunosuppression  Risk factors comment: She denies ever having had sexual intercourse..   Last menstrual period was normal 2 weeks ago.  She admits that she exercises frequently, wearing spandex compression shorts.-- She understands this is a risk factor for yeast infection. Also, recently started nursing school, admits she feels stressed at times.   Past medical history: see below. No history or symptoms of diabetes.   Past Medical History  Diagnosis Date  . GERD (gastroesophageal reflux disease)   . UTI (lower urinary tract infection)   . Ovarian cyst   . Dyspepsia     functional  . Benign neoplasm of eyelid including canthus   . Yeast infection   . Herpes simplex type 1 antibody positive   . Irritable bowel syndrome   . Anal fissure 10/09/2011    Symptoms began in February 2013. Diagnosed by rectal and anoscopic examination 10/09/2011. Diltiazem gel begun.   . Internal hemorrhoids 10/09/2011   Past Surgical History  Procedure Laterality Date  . Myringotomy      x2  . Tympanostomy    . Tonsillectomy    . Nasal sinus surgery    . Laparoscopy   12/19/10  . Upper gastrointestinal endoscopy  10/17/2010    normal  . Tympanoplasty  2000   Family History  Problem Relation Age of Onset  . Diabetes Mother   . Hypertension Mother   . Irritable bowel syndrome Father   . Breast cancer Maternal Grandmother   . Heart disease Maternal Grandfather   . Colon cancer Neg Hx   . Brain cancer Paternal Grandmother     tumor   History  Substance Use Topics  . Smoking status: Never Smoker   . Smokeless tobacco: Never Used  . Alcohol Use: Yes   OB History   Grav Para Term Preterm Abortions TAB SAB Ect Mult Living                 Review of Systems  All other systems reviewed and are negative.    Allergies  Review of patient's allergies indicates not on file.  Home Medications   Current Outpatient Rx  Name  Route  Sig  Dispense  Refill  . busPIRone (BUSPAR) 15 MG tablet   Oral   Take 1 tablet (15 mg total) by mouth 2 (two) times daily.   60 tablet   1   . desogestrel-ethinyl estradiol (KARIVA,AZURETTE,MIRCETTE) 0.15-0.02/0.01 MG (21/5) tablet   Oral   Take 1 tablet by mouth daily.           . fluconazole (DIFLUCAN) 200 MG tablet   Oral   Take 1 tablet (200 mg total) by  mouth daily. For 3 days   3 tablet   1   . Linaclotide (LINZESS) 145 MCG CAPS capsule   Oral   Take 145 mcg by mouth daily. And as directed         . Linaclotide (LINZESS) 145 MCG CAPS capsule   Oral   Take 1 capsule (145 mcg total) by mouth daily. And as directed   30 capsule   11   . Multiple Vitamin (MULTIVITAMIN) capsule   Oral   Take 1 capsule by mouth daily.           . Multiple Vitamins-Minerals (HAIR/SKIN/NAILS PO)   Oral   Take 1 tablet by mouth daily.           . ondansetron (ZOFRAN ODT) 4 MG disintegrating tablet   Oral   Take 1 tablet (4 mg total) by mouth every 8 (eight) hours as needed for nausea.   20 tablet   0   . pantoprazole (PROTONIX) 40 MG tablet   Oral   Take 1 tablet (40 mg total) by mouth daily before  breakfast.   30 tablet   2   . valACYclovir (VALTREX) 1000 MG tablet   Oral   Take 1 tablet (1,000 mg total) by mouth daily.   30 tablet   11    BP 134/77  Pulse 83  Temp(Src) 98.1 F (36.7 C) (Oral)  Ht 5\' 5"  (1.651 m)  Wt 135 lb (61.236 kg)  BMI 22.47 kg/m2  SpO2 100% Physical Exam  Nursing note and vitals reviewed. Constitutional: She is oriented to person, place, and time. She appears well-developed and well-nourished. No distress.  HENT:  Head: Normocephalic and atraumatic.  Eyes: Conjunctivae and EOM are normal. Pupils are equal, round, and reactive to light. No scleral icterus.  Neck: Normal range of motion.  Cardiovascular: Normal rate.   Pulmonary/Chest: Effort normal.  Abdominal: She exhibits no distension.  Musculoskeletal: Normal range of motion.  Neurological: She is alert and oriented to person, place, and time.  Skin: Skin is warm.  Psychiatric: She has a normal mood and affect.   She declined pelvic exam. ED Course  Procedures (including critical care time) Labs Review Labs Reviewed - No data to display Imaging Review No results found.  EKG Interpretation     Ventricular Rate:    PR Interval:    QRS Duration:   QT Interval:    QTC Calculation:   R Axis:     Text Interpretation:              MDM   1. Yeast vaginitis    Risks, benefits, alternatives discussed. She declined pelvic exam.--She states that she has never been sexually active, and she denies any chance or any symptoms of STD. Diflucan 200 mg by mouth daily x3 days. Other general measures discussed, such as wearing cotton underwear instead of spandex. Other hygiene measures discussed. Followup with Dr. Vincente Poli, her GYN if no better one week, sooner if worse or new symptoms. Precautions discussed. Red flags discussed. Questions invited and answered. Patient voiced understanding and agreement.    Lajean Manes, MD 06/12/13 1320

## 2013-06-30 ENCOUNTER — Telehealth: Payer: Self-pay | Admitting: Internal Medicine

## 2013-06-30 DIAGNOSIS — K30 Functional dyspepsia: Secondary | ICD-10-CM

## 2013-07-01 MED ORDER — BUSPIRONE HCL 5 MG PO TABS: 5.0000 mg | ORAL_TABLET | Freq: Two times a day (BID) | ORAL | Status: DC

## 2013-07-01 NOTE — Telephone Encounter (Signed)
Patient would like an alternative to buspar,  She says taking 1/2 tablet a day "knocks me out where I can't wake up".  She is requesting an alternative

## 2013-07-01 NOTE — Telephone Encounter (Signed)
Patient aware of the new rx and instructions.  She will call back for additional questions or concerns

## 2013-07-01 NOTE — Telephone Encounter (Signed)
Change to 5 mg bid (or qd)  There are 5 mg tabs  Rx # 180 with 3 refills

## 2013-07-02 ENCOUNTER — Telehealth: Payer: Self-pay | Admitting: Internal Medicine

## 2013-07-04 ENCOUNTER — Telehealth: Payer: Self-pay | Admitting: Internal Medicine

## 2013-07-04 MED ORDER — DULOXETINE HCL 60 MG PO CPEP: 60.0000 mg | ORAL_CAPSULE | Freq: Every day | ORAL | Status: DC

## 2013-07-04 NOTE — Telephone Encounter (Signed)
I am aware and am working on this.

## 2013-07-04 NOTE — Telephone Encounter (Signed)
Still sleepy even with 5 mg buspirone Will switch to duloxetine 30 mg daily x 2 weeks then 60 mg daily She is to see me in [place]

## 2013-07-08 ENCOUNTER — Telehealth: Payer: Self-pay | Admitting: Internal Medicine

## 2013-07-08 NOTE — Telephone Encounter (Signed)
Kathrine Cords verbal order for Cymbalta over the phone per Dr. Leone Payor script printed on 07/04/13.

## 2013-07-13 ENCOUNTER — Emergency Department
Admission: EM | Admit: 2013-07-13 | Discharge: 2013-07-13 | Disposition: A | Discharge status: Home / Self Care | Payer: 59 | Source: Home / Self Care | Attending: Family Medicine | Admitting: Family Medicine

## 2013-07-13 ENCOUNTER — Encounter: Payer: Self-pay | Admitting: Emergency Medicine

## 2013-07-13 DIAGNOSIS — J069 Acute upper respiratory infection, unspecified: Secondary | ICD-10-CM

## 2013-07-13 DIAGNOSIS — J9801 Acute bronchospasm: Secondary | ICD-10-CM

## 2013-07-13 LAB — POCT RAPID STREP A (OFFICE): Rapid Strep A Screen: NEGATIVE

## 2013-07-13 MED ORDER — PREDNISONE 50 MG PO TABS: ORAL_TABLET | ORAL | Status: DC

## 2013-07-13 MED ORDER — BENZONATATE 100 MG PO CAPS: 100.0000 mg | ORAL_CAPSULE | Freq: Three times a day (TID) | ORAL | Status: DC | PRN

## 2013-07-13 MED ORDER — METHYLPREDNISOLONE SODIUM SUCC 125 MG IJ SOLR
80.0000 mg | Freq: Once | INTRAMUSCULAR | Status: AC
  Administered 2013-07-13: 80 mg via INTRAMUSCULAR

## 2013-07-13 MED ORDER — ALBUTEROL SULFATE HFA 108 (90 BASE) MCG/ACT IN AERS: 2.0000 | INHALATION_SPRAY | RESPIRATORY_TRACT | Status: DC | PRN

## 2013-07-13 NOTE — ED Provider Notes (Signed)
CSN: [id number]     Arrival date & time 07/13/13  1323 History   First MD Initiated Contact with Patient 07/13/13 1324     Chief Complaint  Patient presents with  . URI    HPI  URI Symptoms Onset: 3-4 days  Description: rhinorrhea, nasal congestion, cough  Modifying factors:  None   Symptoms Nasal discharge: yes Fever: no Sore throat: yes Cough: yes Wheezing: unsure  Ear pain: no GI symptoms: no Sick contacts: yes  Red Flags  Stiff neck: no Dyspnea: no Rash: no Swallowing difficulty: no  Sinusitis Risk Factors Headache/face pain: no Double sickening: no tooth pain: no  Allergy Risk Factors Sneezing: no Itchy scratchy throat: no Seasonal symptoms: no  Flu Risk Factors Headache: no muscle aches: no severe fatigue: no   Past Medical History  Diagnosis Date  . GERD (gastroesophageal reflux disease)   . UTI (lower urinary tract infection)   . Ovarian cyst   . Dyspepsia     functional  . Benign neoplasm of eyelid including canthus   . Yeast infection   . Herpes simplex type 1 antibody positive   . Irritable bowel syndrome   . Anal fissure 10/09/2011    Symptoms began in February 2013. Diagnosed by rectal and anoscopic examination 10/09/2011. Diltiazem gel begun.   . Internal hemorrhoids 10/09/2011   Past Surgical History  Procedure Laterality Date  . Myringotomy      x2  . Tympanostomy    . Tonsillectomy    . Nasal sinus surgery    . Laparoscopy  12/19/10  . Upper gastrointestinal endoscopy  10/17/2010    normal  . Tympanoplasty  2000   Family History  Problem Relation Age of Onset  . Diabetes Mother   . Hypertension Mother   . Irritable bowel syndrome Father   . Breast cancer Maternal Grandmother   . Heart disease Maternal Grandfather   . Colon cancer Neg Hx   . Brain cancer Paternal Grandmother     tumor   History  Substance Use Topics  . Smoking status: Never Smoker   . Smokeless tobacco: Never Used  . Alcohol Use: Yes   OB History    Grav Para Term Preterm Abortions TAB SAB Ect Mult Living                 Review of Systems  All other systems reviewed and are negative.    Allergies  Review of patient's allergies indicates no known allergies.  Home Medications   Current Outpatient Rx  Name  Route  Sig  Dispense  Refill  . desogestrel-ethinyl estradiol (KARIVA,AZURETTE,MIRCETTE) 0.15-0.02/0.01 MG (21/5) tablet   Oral   Take 1 tablet by mouth daily.           . DULoxetine (CYMBALTA) 60 MG capsule   Oral   Take 1 capsule (60 mg total) by mouth daily with breakfast. Please give seven (7)  30 mg capsules so she can start with 30 mg qd for first week then 60 mg qd   30 capsule   5   . fluconazole (DIFLUCAN) 200 MG tablet   Oral   Take 1 tablet (200 mg total) by mouth daily. For 3 days   3 tablet   1   . Linaclotide (LINZESS) 145 MCG CAPS capsule   Oral   Take 1 capsule (145 mcg total) by mouth daily. And as directed   30 capsule   11   . Multiple Vitamin (MULTIVITAMIN) capsule  Oral   Take 1 capsule by mouth daily.           . Multiple Vitamins-Minerals (HAIR/SKIN/NAILS PO)   Oral   Take 1 tablet by mouth daily.           . ondansetron (ZOFRAN ODT) 4 MG disintegrating tablet   Oral   Take 1 tablet (4 mg total) by mouth every 8 (eight) hours as needed for nausea.   20 tablet   0   . pantoprazole (PROTONIX) 40 MG tablet   Oral   Take 1 tablet (40 mg total) by mouth daily before breakfast.   30 tablet   2   . valACYclovir (VALTREX) 1000 MG tablet   Oral   Take 1 tablet (1,000 mg total) by mouth daily.   30 tablet   11    BP 96/66  Pulse 82  Temp(Src) 97.7 F (36.5 C) (Oral)  Ht 5\' 6"  (1.676 m)  Wt 133 lb (60.328 kg)  BMI 21.48 kg/m2  SpO2 99% Physical Exam  Constitutional: She is oriented to person, place, and time. She appears well-developed and well-nourished.  HENT:  Head: Normocephalic and atraumatic.  Right Ear: External ear normal.  Left Ear: External ear normal.   +nasal erythema, rhinorrhea bilaterally, + post oropharyngeal erythema    Eyes: Conjunctivae are normal. Pupils are equal, round, and reactive to light.  Neck: Normal range of motion. Neck supple.  Cardiovascular: Normal rate and regular rhythm.   Pulmonary/Chest: Effort normal. She has wheezes.  Abdominal: Soft.  Musculoskeletal: Normal range of motion.  Neurological: She is alert and oriented to person, place, and time.  Skin: Skin is warm.    ED Course  Procedures (including critical care time) Labs Review Labs Reviewed - No data to display Imaging Review No results found.  EKG Interpretation    Date/Time:    Ventricular Rate:    PR Interval:    QRS Duration:   QT Interval:    QTC Calculation:   R Axis:     Text Interpretation:              MDM   1. URI (upper respiratory infection)   2. Bronchospasm    Likley viral URI with secondary bronchospasm Rapid strep negative Will treat with solumedrol 80mg  IM x1 Short course of prednisone  Prn albuterol Stable resp exam apart from cough and wheeze on exam.  Satting well.  Dsicussed infectious and resp red flags at length.  Follow up as needed.     The patient and/or caregiver has been counseled thoroughly with regard to treatment plan and/or medications prescribed including dosage, schedule, interactions, rationale for use, and possible side effects and they verbalize understanding. Diagnoses and expected course of recovery discussed and will return if not improved as expected or if the condition worsens. Patient and/or caregiver verbalized understanding.         Doree Albee, MD 07/13/13 [phone]

## 2013-07-13 NOTE — ED Notes (Signed)
storted Wed with cough, congestion, sore throat and headache

## 2013-07-15 ENCOUNTER — Encounter (HOSPITAL_BASED_OUTPATIENT_CLINIC_OR_DEPARTMENT_OTHER): Payer: Self-pay | Admitting: Emergency Medicine

## 2013-07-15 ENCOUNTER — Emergency Department (HOSPITAL_BASED_OUTPATIENT_CLINIC_OR_DEPARTMENT_OTHER)
Admission: EM | Admit: 2013-07-15 | Discharge: 2013-07-15 | Disposition: A | Discharge status: Home / Self Care | Payer: 59 | Attending: Emergency Medicine | Admitting: Emergency Medicine

## 2013-07-15 DIAGNOSIS — Z8679 Personal history of other diseases of the circulatory system: Secondary | ICD-10-CM | POA: Insufficient documentation

## 2013-07-15 DIAGNOSIS — H7291 Unspecified perforation of tympanic membrane, right ear: Secondary | ICD-10-CM

## 2013-07-15 DIAGNOSIS — Z8619 Personal history of other infectious and parasitic diseases: Secondary | ICD-10-CM | POA: Insufficient documentation

## 2013-07-15 DIAGNOSIS — Z87440 Personal history of urinary (tract) infections: Secondary | ICD-10-CM | POA: Insufficient documentation

## 2013-07-15 DIAGNOSIS — Z79899 Other long term (current) drug therapy: Secondary | ICD-10-CM | POA: Insufficient documentation

## 2013-07-15 DIAGNOSIS — K219 Gastro-esophageal reflux disease without esophagitis: Secondary | ICD-10-CM | POA: Insufficient documentation

## 2013-07-15 DIAGNOSIS — IMO0002 Reserved for concepts with insufficient information to code with codable children: Secondary | ICD-10-CM | POA: Insufficient documentation

## 2013-07-15 DIAGNOSIS — K589 Irritable bowel syndrome without diarrhea: Secondary | ICD-10-CM | POA: Insufficient documentation

## 2013-07-15 DIAGNOSIS — Z8742 Personal history of other diseases of the female genital tract: Secondary | ICD-10-CM | POA: Insufficient documentation

## 2013-07-15 DIAGNOSIS — H6691 Otitis media, unspecified, right ear: Secondary | ICD-10-CM

## 2013-07-15 DIAGNOSIS — Z9889 Other specified postprocedural states: Secondary | ICD-10-CM | POA: Insufficient documentation

## 2013-07-15 DIAGNOSIS — H7290 Unspecified perforation of tympanic membrane, unspecified ear: Secondary | ICD-10-CM | POA: Insufficient documentation

## 2013-07-15 DIAGNOSIS — H6690 Otitis media, unspecified, unspecified ear: Secondary | ICD-10-CM | POA: Insufficient documentation

## 2013-07-15 MED ORDER — AMOXICILLIN 500 MG PO CAPS
1000.0000 mg | ORAL_CAPSULE | Freq: Once | ORAL | Status: AC
  Administered 2013-07-15: 1000 mg via ORAL
  Filled 2013-07-15: qty 2

## 2013-07-15 MED ORDER — AMOXICILLIN 500 MG PO CAPS: 1000.0000 mg | ORAL_CAPSULE | Freq: Three times a day (TID) | ORAL | Status: DC

## 2013-07-15 MED ORDER — HYDROCODONE-ACETAMINOPHEN 5-325 MG PO TABS
2.0000 | ORAL_TABLET | Freq: Once | ORAL | Status: AC
  Administered 2013-07-15: 2 via ORAL
  Filled 2013-07-15: qty 2

## 2013-07-15 MED ORDER — FLUCONAZOLE 150 MG PO TABS: ORAL_TABLET | ORAL | Status: DC

## 2013-07-15 MED ORDER — HYDROCODONE-ACETAMINOPHEN 5-325 MG PO TABS
1.0000 | ORAL_TABLET | Freq: Four times a day (QID) | ORAL | Status: DC | PRN
Start: 2013-07-15 — End: 2015-07-29

## 2013-07-15 NOTE — ED Provider Notes (Signed)
CSN: [id number]     Arrival date & time 07/15/13  0335 History   First MD Initiated Contact with Patient 07/15/13 0350     Chief Complaint  Patient presents with  . Earache    (Consider location/radiation/quality/duration/timing/severity/associated sxs/prior Treatment) HPI This is a 22 year old female status post left tympanoplasty. She was seen 2 days ago for cough, shortness of breath, nasal congestion, sinus pain and rhinorrhea. She was diagnosed with a sinus infection and given a shot of steroids and placed on a steroid taper and given albuterol inhaler and benzonatate.  She is here now with severe, sharp right ear pain that began suddenly about 2 hours ago. She has had serous drainage from that ear subsequently. Her hearing is decreased in the right ear.  Past Medical History  Diagnosis Date  . GERD (gastroesophageal reflux disease)   . UTI (lower urinary tract infection)   . Ovarian cyst   . Dyspepsia     functional  . Benign neoplasm of eyelid including canthus   . Yeast infection   . Herpes simplex type 1 antibody positive   . Irritable bowel syndrome   . Anal fissure 10/09/2011    Symptoms began in February 2013. Diagnosed by rectal and anoscopic examination 10/09/2011. Diltiazem gel begun.   . Internal hemorrhoids 10/09/2011   Past Surgical History  Procedure Laterality Date  . Myringotomy      x2  . Tympanostomy    . Tonsillectomy    . Nasal sinus surgery    . Laparoscopy  12/19/10  . Upper gastrointestinal endoscopy  10/17/2010    normal  . Tympanoplasty  2000   Family History  Problem Relation Age of Onset  . Diabetes Mother   . Hypertension Mother   . Irritable bowel syndrome Father   . Breast cancer Maternal Grandmother   . Heart disease Maternal Grandfather   . Colon cancer Neg Hx   . Brain cancer Paternal Grandmother     tumor   History  Substance Use Topics  . Smoking status: Never Smoker   . Smokeless tobacco: Never Used  . Alcohol Use: Yes    OB History   Grav Para Term Preterm Abortions TAB SAB Ect Mult Living                 Review of Systems  All other systems reviewed and are negative.    Allergies  Review of patient's allergies indicates no known allergies.  Home Medications   Current Outpatient Rx  Name  Route  Sig  Dispense  Refill  . albuterol (PROVENTIL HFA;VENTOLIN HFA) 108 (90 BASE) MCG/ACT inhaler   Inhalation   Inhale 2 puffs into the lungs every 4 (four) hours as needed for wheezing or shortness of breath (cough, shortness of breath or wheezing.).   1 Inhaler   1   . benzonatate (TESSALON) 100 MG capsule   Oral   Take 1-2 capsules (100-200 mg total) by mouth 3 (three) times daily as needed for cough.   40 capsule   0   . desogestrel-ethinyl estradiol (KARIVA,AZURETTE,MIRCETTE) 0.15-0.02/0.01 MG (21/5) tablet   Oral   Take 1 tablet by mouth daily.           . DULoxetine (CYMBALTA) 60 MG capsule   Oral   Take 1 capsule (60 mg total) by mouth daily with breakfast. Please give seven (7)  30 mg capsules so she can start with 30 mg qd for first week then 60 mg qd  30 capsule   5   . fluconazole (DIFLUCAN) 200 MG tablet   Oral   Take 1 tablet (200 mg total) by mouth daily. For 3 days   3 tablet   1   . Linaclotide (LINZESS) 145 MCG CAPS capsule   Oral   Take 1 capsule (145 mcg total) by mouth daily. And as directed   30 capsule   11   . Multiple Vitamin (MULTIVITAMIN) capsule   Oral   Take 1 capsule by mouth daily.           . Multiple Vitamins-Minerals (HAIR/SKIN/NAILS PO)   Oral   Take 1 tablet by mouth daily.           . ondansetron (ZOFRAN ODT) 4 MG disintegrating tablet   Oral   Take 1 tablet (4 mg total) by mouth every 8 (eight) hours as needed for nausea.   20 tablet   0   . pantoprazole (PROTONIX) 40 MG tablet   Oral   Take 1 tablet (40 mg total) by mouth daily before breakfast.   30 tablet   2   . predniSONE (DELTASONE) 50 MG tablet      1 tab daily x 5  days   5 tablet   0   . valACYclovir (VALTREX) 1000 MG tablet   Oral   Take 1 tablet (1,000 mg total) by mouth daily.   30 tablet   11    BP 119/81  Pulse 66  Temp(Src) 98.4 F (36.9 C) (Oral)  Resp 18  Ht 5\' 6"  (1.676 m)  Wt 133 lb (60.328 kg)  BMI 21.48 kg/m2  SpO2 100%  LMP 06/24/2013  Physical Exam General: Well-developed, well-nourished female in no acute distress; appearance consistent with age of record HENT: normocephalic; atraumatic; nasal congestion; scarring of left TM; right TM erythematous with frothy fluid in the external auditory canal consistent with rupture Eyes: pupils equal, round and reactive to light; extraocular muscles intact Neck: supple Heart: regular rate and rhythm Lungs: clear to auscultation bilaterally Abdomen: soft; nondistended; nontender; no masses or hepatosplenomegaly; bowel sounds present Extremities: No deformity; full range of motion Neurologic: Awake, alert and oriented; motor function intact in all extremities and symmetric; no facial droop Skin: Warm and dry Psychiatric: Tearful    ED Course  Procedures (including critical care time)   MDM      Hanley Seamen, MD 07/15/13 0403

## 2013-07-15 NOTE — ED Notes (Addendum)
C/o sudden onset rt ear pain approx 2 hours ago  Denies inj,  w drainage   Pt being treated for sinus infection

## 2013-08-22 ENCOUNTER — Emergency Department (HOSPITAL_BASED_OUTPATIENT_CLINIC_OR_DEPARTMENT_OTHER): Payer: 59

## 2013-08-22 ENCOUNTER — Encounter (HOSPITAL_BASED_OUTPATIENT_CLINIC_OR_DEPARTMENT_OTHER): Payer: Self-pay | Admitting: Emergency Medicine

## 2013-08-22 ENCOUNTER — Emergency Department (HOSPITAL_BASED_OUTPATIENT_CLINIC_OR_DEPARTMENT_OTHER)
Admission: EM | Admit: 2013-08-22 | Discharge: 2013-08-22 | Disposition: A | Discharge status: Home / Self Care | Payer: 59 | Attending: Emergency Medicine | Admitting: Emergency Medicine

## 2013-08-22 DIAGNOSIS — J111 Influenza due to unidentified influenza virus with other respiratory manifestations: Secondary | ICD-10-CM | POA: Insufficient documentation

## 2013-08-22 DIAGNOSIS — Z8619 Personal history of other infectious and parasitic diseases: Secondary | ICD-10-CM | POA: Insufficient documentation

## 2013-08-22 DIAGNOSIS — Z8742 Personal history of other diseases of the female genital tract: Secondary | ICD-10-CM | POA: Insufficient documentation

## 2013-08-22 DIAGNOSIS — K219 Gastro-esophageal reflux disease without esophagitis: Secondary | ICD-10-CM | POA: Insufficient documentation

## 2013-08-22 DIAGNOSIS — Z79899 Other long term (current) drug therapy: Secondary | ICD-10-CM | POA: Insufficient documentation

## 2013-08-22 DIAGNOSIS — IMO0002 Reserved for concepts with insufficient information to code with codable children: Secondary | ICD-10-CM | POA: Insufficient documentation

## 2013-08-22 DIAGNOSIS — Z8679 Personal history of other diseases of the circulatory system: Secondary | ICD-10-CM | POA: Insufficient documentation

## 2013-08-22 DIAGNOSIS — Z792 Long term (current) use of antibiotics: Secondary | ICD-10-CM | POA: Insufficient documentation

## 2013-08-22 DIAGNOSIS — Z87440 Personal history of urinary (tract) infections: Secondary | ICD-10-CM | POA: Insufficient documentation

## 2013-08-22 DIAGNOSIS — Z8669 Personal history of other diseases of the nervous system and sense organs: Secondary | ICD-10-CM | POA: Insufficient documentation

## 2013-08-22 MED ORDER — HYDROCOD POLST-CHLORPHEN POLST 10-8 MG/5ML PO LQCR
5.0000 mL | Freq: Once | ORAL | Status: AC
  Administered 2013-08-22: 5 mL via ORAL
  Filled 2013-08-22: qty 5

## 2013-08-22 MED ORDER — HYDROCOD POLST-CHLORPHEN POLST 10-8 MG/5ML PO LQCR: 5.0000 mL | Freq: Two times a day (BID) | ORAL | Status: DC | PRN

## 2013-08-22 MED ORDER — GUAIFENESIN 100 MG/5ML PO LIQD: 100.0000 mg | ORAL | Status: DC | PRN

## 2013-08-22 NOTE — ED Notes (Signed)
Cough and body aches since yesterday.

## 2013-08-22 NOTE — ED Provider Notes (Signed)
CSN: [id number]     Arrival date & time 08/22/13  2220 History  This chart was scribed for Hanley Seamen, MD by Carl Best, ED Scribe. This patient was seen in room MH01/MH01 and the patient's care was started at 10:49 PM.     Chief Complaint  Patient presents with  . Cough    Patient is a 23 y.o. female presenting with cough. The history is provided by the patient. No language interpreter was used.  Cough Associated symptoms: chills and rhinorrhea   Associated symptoms: no ear pain and no fever    HPI Comments: Crystal [name] is a 23 y.o. female who presents to the Emergency Department complaining of constant cough productive of brown sputum and myalgias that started yesterday.  The patient lists chest pain that is associated with cough, decreased appetite, fatigue, and chills as associated symptoms.  She denies nasal congestion, nausea, vomiting, diarrhea, earache, and fever as associated symptoms.    Past Medical History  Diagnosis Date  . GERD (gastroesophageal reflux disease)   . UTI (lower urinary tract infection)   . Ovarian cyst   . Dyspepsia     functional  . Benign neoplasm of eyelid including canthus   . Yeast infection   . Herpes simplex type 1 antibody positive   . Irritable bowel syndrome   . Anal fissure 10/09/2011    Symptoms began in February 2013. Diagnosed by rectal and anoscopic examination 10/09/2011. Diltiazem gel begun.   . Internal hemorrhoids 10/09/2011   Past Surgical History  Procedure Laterality Date  . Myringotomy      x2  . Tympanostomy    . Tonsillectomy    . Nasal sinus surgery    . Laparoscopy  12/19/10  . Upper gastrointestinal endoscopy  10/17/2010    normal  . Tympanoplasty  2000   Family History  Problem Relation Age of Onset  . Diabetes Mother   . Hypertension Mother   . Irritable bowel syndrome Father   . Breast cancer Maternal Grandmother   . Heart disease Maternal Grandfather   . Colon cancer Neg Hx   . Brain cancer Paternal  Grandmother     tumor   History  Substance Use Topics  . Smoking status: Never Smoker   . Smokeless tobacco: Never Used  . Alcohol Use: Yes   OB History   Grav Para Term Preterm Abortions TAB SAB Ect Mult Living                 Review of Systems  Constitutional: Positive for chills, appetite change (decreased) and fatigue. Negative for fever.  HENT: Positive for rhinorrhea. Negative for ear pain.   Respiratory: Positive for cough.   Gastrointestinal: Negative for nausea, vomiting and diarrhea.  All other systems reviewed and are negative.    Allergies  Review of patient's allergies indicates no known allergies.  Home Medications   Current Outpatient Rx  Name  Route  Sig  Dispense  Refill  . albuterol (PROVENTIL HFA;VENTOLIN HFA) 108 (90 BASE) MCG/ACT inhaler   Inhalation   Inhale 2 puffs into the lungs every 4 (four) hours as needed for wheezing or shortness of breath (cough, shortness of breath or wheezing.).   1 Inhaler   1   . amoxicillin (AMOXIL) 500 MG capsule   Oral   Take 2 capsules (1,000 mg total) by mouth 3 (three) times daily.   60 capsule   0   . benzonatate (TESSALON) 100 MG capsule  Oral   Take 1-2 capsules (100-200 mg total) by mouth 3 (three) times daily as needed for cough.   40 capsule   0   . chlorpheniramine-HYDROcodone (TUSSIONEX PENNKINETIC ER) 10-8 MG/5ML LQCR   Oral   Take 5 mLs by mouth every 12 (twelve) hours as needed for cough.   70 mL   0   . desogestrel-ethinyl estradiol (KARIVA,AZURETTE,MIRCETTE) 0.15-0.02/0.01 MG (21/5) tablet   Oral   Take 1 tablet by mouth daily.           . DULoxetine (CYMBALTA) 60 MG capsule   Oral   Take 1 capsule (60 mg total) by mouth daily with breakfast. Please give seven (7)  30 mg capsules so she can start with 30 mg qd for first week then 60 mg qd   30 capsule   5   . fluconazole (DIFLUCAN) 150 MG tablet      Take 1 tablet once as needed for vaginal yeast infection.   1 tablet   1   .  fluconazole (DIFLUCAN) 200 MG tablet   Oral   Take 1 tablet (200 mg total) by mouth daily. For 3 days   3 tablet   1   . guaiFENesin (ROBITUSSIN) 100 MG/5ML liquid   Oral   Take 5-10 mLs (100-200 mg total) by mouth every 4 (four) hours as needed for cough.         Marland Kitchen HYDROcodone-acetaminophen (NORCO/VICODIN) 5-325 MG per tablet   Oral   Take 1-2 tablets by mouth every 6 (six) hours as needed for moderate pain.   20 tablet   0   . Linaclotide (LINZESS) 145 MCG CAPS capsule   Oral   Take 1 capsule (145 mcg total) by mouth daily. And as directed   30 capsule   11   . Multiple Vitamin (MULTIVITAMIN) capsule   Oral   Take 1 capsule by mouth daily.           . Multiple Vitamins-Minerals (HAIR/SKIN/NAILS PO)   Oral   Take 1 tablet by mouth daily.           . ondansetron (ZOFRAN ODT) 4 MG disintegrating tablet   Oral   Take 1 tablet (4 mg total) by mouth every 8 (eight) hours as needed for nausea.   20 tablet   0   . pantoprazole (PROTONIX) 40 MG tablet   Oral   Take 1 tablet (40 mg total) by mouth daily before breakfast.   30 tablet   2   . predniSONE (DELTASONE) 50 MG tablet      1 tab daily x 5 days   5 tablet   0   . valACYclovir (VALTREX) 1000 MG tablet   Oral   Take 1 tablet (1,000 mg total) by mouth daily.   30 tablet   11    Triage Vitals: BP 152/96  Pulse 102  Temp(Src) 99.3 F (37.4 C) (Oral)  Resp 18  Ht 5\' 7"  (1.702 m)  Wt 132 lb (59.875 kg)  BMI 20.67 kg/m2  SpO2 100%  LMP 08/19/2013  Physical Exam  Vitals reviewed. Constitutional: She is oriented to person, place, and time. She appears well-developed and well-nourished. No distress.  HENT:  Head: Normocephalic and atraumatic.  Right Ear: Hearing, tympanic membrane, external ear and ear canal normal.  Left Ear: Hearing, external ear and ear canal normal. Tympanic membrane is scarred.  Nose: Nose normal.  Mouth/Throat: Oropharynx is clear and moist. No oropharyngeal exudate or  posterior  oropharyngeal erythema.  Eyes: Conjunctivae and EOM are normal. Pupils are equal, round, and reactive to light.  Cardiovascular: Normal rate, regular rhythm, normal heart sounds and intact distal pulses.   No murmur heard. Pulmonary/Chest: Effort normal and breath sounds normal. No respiratory distress. She has no wheezes. She has no rhonchi.  Rhonchorous cough.  Abdominal: Soft. There is no tenderness.  Neurological: She is alert and oriented to person, place, and time.  Skin: Skin is warm and dry. No rash noted.  Psychiatric: She has a normal mood and affect. Her behavior is normal.    ED Course  Procedures (including critical care time)  DIAGNOSTIC STUDIES: Oxygen Saturation is 100% on room air, normal by my interpretation.    COORDINATION OF CARE: 10:54 PM- Discussed administering an albuterol inhaler and a clinical suspicion of influenza.    MDM   1. Influenza    Nursing notes and vitals signs, including pulse oximetry, reviewed.  Summary of this visit's results, reviewed by myself:  Imaging Studies: Dg Chest 2 View  08/22/2013   CLINICAL DATA:  Cough and body aches.  EXAM: CHEST  2 VIEW  COMPARISON:  Chest radiograph performed 05/08/2008  FINDINGS: The lungs are well-aerated and clear. There is no evidence of focal opacification, pleural effusion or pneumothorax.  The heart is normal in size; the mediastinal contour is within normal limits. No acute osseous abnormalities are seen.  IMPRESSION: No acute cardiopulmonary process seen.   Electronically Signed   By: Garald Balding M.D.   On: 08/22/2013 23:15     I personally performed the services described in this documentation, which was scribed in my presence.  The recorded information has been reviewed and is accurate.    Wynetta Fines, MD 08/22/13 [phone]

## 2013-11-25 ENCOUNTER — Telehealth: Payer: Self-pay | Admitting: Internal Medicine

## 2013-11-25 NOTE — Telephone Encounter (Signed)
Saw mom at [facility] and she told me Crystal [name] is having a lot of bloating issues. Long wait for appointment - ? Sooner visit.  We will call her to get a symptom update and to see if she is taking duloxetine, other meds on list and then go from there

## 2013-11-26 NOTE — Telephone Encounter (Signed)
She reports nausea with meals and wakes up nauseated. Nausea symptoms started about 2 weeks ago.   She reports that she is not taking cymbalta , stopped about 2 months ago.  She states she is not pregnant.  She is no longer on a PPI and has not been taking her zofran.  She is asked to take zofran for nausea.  Please advise

## 2013-11-27 NOTE — Telephone Encounter (Signed)
See if we can work her in this month please

## 2013-12-01 NOTE — Telephone Encounter (Signed)
Left message for patient to call back  

## 2013-12-02 NOTE — Telephone Encounter (Signed)
Patient is rescheduled to 12/17/13

## 2013-12-17 ENCOUNTER — Encounter: Payer: Self-pay | Admitting: Internal Medicine

## 2013-12-17 ENCOUNTER — Ambulatory Visit (INDEPENDENT_AMBULATORY_CARE_PROVIDER_SITE_OTHER): Payer: 59 | Admitting: Internal Medicine

## 2013-12-17 VITALS — BP 100/58 | HR 72 | Ht 65.0 in | Wt 128.4 lb

## 2013-12-17 DIAGNOSIS — K589 Irritable bowel syndrome without diarrhea: Secondary | ICD-10-CM

## 2013-12-17 DIAGNOSIS — R1013 Epigastric pain: Secondary | ICD-10-CM

## 2013-12-17 DIAGNOSIS — K3189 Other diseases of stomach and duodenum: Secondary | ICD-10-CM

## 2013-12-17 DIAGNOSIS — K30 Functional dyspepsia: Secondary | ICD-10-CM

## 2013-12-17 MED ORDER — OMEPRAZOLE-SODIUM BICARBONATE 40-1100 MG PO CAPS: 1.0000 | ORAL_CAPSULE | Freq: Every day | ORAL | Status: DC

## 2013-12-17 MED ORDER — HYOSCYAMINE SULFATE 0.125 MG SL SUBL: 0.1250 mg | SUBLINGUAL_TABLET | SUBLINGUAL | Status: DC | PRN

## 2013-12-17 NOTE — Assessment & Plan Note (Addendum)
flaring, hyoscyamine 0.125 mg when necessary

## 2013-12-17 NOTE — Assessment & Plan Note (Addendum)
Flaring, we'll add Zegerid at bedtime, 40 mg dose and intermittent hyoscyamine. She will followup as needed. Once school and she may not need Zegerid. She will see she can come off it.

## 2013-12-17 NOTE — Progress Notes (Signed)
         Subjective:    Patient ID: Crystal [name], female    DOB: [birth year], 23 y.o.   MRN: [id number]  HPI Crystal [name] is here for followup. She's been having more stomach trouble. In the winter I had recommended duloxetine, which she took for about 2 months but because nauseous and she stopped. The nausea abated and she was doing well. In the past month or so she's been having some problems with epigastric pain and severe bloating and abdominal distention mainly in the morning. She will have some symptoms at night as well. Heartburn, GERD-like symptoms. He is lost a little bit of weight because she'll differently. She has been under stress with finals at school and is having some diarrhea prior tests Crystal [name].  She is moved out of the house, he is living with her fianc, she is newly engaged and that is going well. Menses are regular and she is on oral contraceptives.  Medications, allergies, past medical history, past surgical history, family history and social history are reviewed and updated in the EMR.  Review of Systems As above    Objective:   Physical Exam General:  NAD, pleasant well-developed and well-nourished young white woman Eyes:   anicteric Abdomen:  soft and nontender, BS+ no organomegaly or mass     Assessment & Plan:  Functional dyspepsia Flaring, we'll add Zegerid at bedtime, 40 mg dose and intermittent hyoscyamine. She will followup as needed. Once school and she may not need Zegerid. She will see she can come off it.  IBS (irritable bowel syndrome)  flaring, hyoscyamine 0.125 mg when necessary

## 2013-12-17 NOTE — Patient Instructions (Signed)
We have sent the following medications to your pharmacy for you to pick up at your convenience:Zegerid and Levsin.  Please follow up with Dr. Carlean Purl as needed.

## 2014-01-19 ENCOUNTER — Ambulatory Visit: Payer: 59 | Admitting: Internal Medicine

## 2014-02-01 ENCOUNTER — Emergency Department (HOSPITAL_BASED_OUTPATIENT_CLINIC_OR_DEPARTMENT_OTHER)
Admission: EM | Admit: 2014-02-01 | Discharge: 2014-02-01 | Disposition: A | Discharge status: Home / Self Care | Payer: 59 | Attending: Emergency Medicine | Admitting: Emergency Medicine

## 2014-02-01 ENCOUNTER — Encounter (HOSPITAL_BASED_OUTPATIENT_CLINIC_OR_DEPARTMENT_OTHER): Payer: Self-pay | Admitting: Emergency Medicine

## 2014-02-01 DIAGNOSIS — Z8742 Personal history of other diseases of the female genital tract: Secondary | ICD-10-CM | POA: Insufficient documentation

## 2014-02-01 DIAGNOSIS — Z87440 Personal history of urinary (tract) infections: Secondary | ICD-10-CM | POA: Insufficient documentation

## 2014-02-01 DIAGNOSIS — Z79899 Other long term (current) drug therapy: Secondary | ICD-10-CM | POA: Insufficient documentation

## 2014-02-01 DIAGNOSIS — K589 Irritable bowel syndrome without diarrhea: Secondary | ICD-10-CM | POA: Insufficient documentation

## 2014-02-01 DIAGNOSIS — K529 Noninfective gastroenteritis and colitis, unspecified: Secondary | ICD-10-CM

## 2014-02-01 DIAGNOSIS — Z8619 Personal history of other infectious and parasitic diseases: Secondary | ICD-10-CM | POA: Insufficient documentation

## 2014-02-01 DIAGNOSIS — Z3202 Encounter for pregnancy test, result negative: Secondary | ICD-10-CM | POA: Insufficient documentation

## 2014-02-01 DIAGNOSIS — Z9889 Other specified postprocedural states: Secondary | ICD-10-CM | POA: Insufficient documentation

## 2014-02-01 DIAGNOSIS — Z8679 Personal history of other diseases of the circulatory system: Secondary | ICD-10-CM | POA: Insufficient documentation

## 2014-02-01 DIAGNOSIS — K219 Gastro-esophageal reflux disease without esophagitis: Secondary | ICD-10-CM | POA: Insufficient documentation

## 2014-02-01 DIAGNOSIS — K5289 Other specified noninfective gastroenteritis and colitis: Secondary | ICD-10-CM | POA: Insufficient documentation

## 2014-02-01 LAB — CBC WITH DIFFERENTIAL/PLATELET
Basophils Absolute: 0 10*3/uL (ref 0.0–0.1)
Basophils Relative: 0 % (ref 0–1)
Eosinophils Absolute: 0 10*3/uL (ref 0.0–0.7)
Eosinophils Relative: 0 % (ref 0–5)
HCT: 40 % (ref 36.0–46.0)
Hemoglobin: 14.3 g/dL (ref 12.0–15.0)
Lymphocytes Relative: 8 % — ABNORMAL LOW (ref 12–46)
Lymphs Abs: 1.2 10*3/uL (ref 0.7–4.0)
MCH: 32.7 pg (ref 26.0–34.0)
MCHC: 35.8 g/dL (ref 30.0–36.0)
MCV: 91.5 fL (ref 78.0–100.0)
Monocytes Absolute: 0.8 10*3/uL (ref 0.1–1.0)
Monocytes Relative: 5 % (ref 3–12)
Neutro Abs: 13.4 10*3/uL — ABNORMAL HIGH (ref 1.7–7.7)
Neutrophils Relative %: 87 % — ABNORMAL HIGH (ref 43–77)
Platelets: 204 10*3/uL (ref 150–400)
RBC: 4.37 MIL/uL (ref 3.87–5.11)
RDW: 12.3 % (ref 11.5–15.5)
WBC: 15.5 10*3/uL — ABNORMAL HIGH (ref 4.0–10.5)

## 2014-02-01 LAB — BASIC METABOLIC PANEL
Anion gap: 12 (ref 5–15)
BUN: 20 mg/dL (ref 6–23)
CO2: 26 mEq/L (ref 19–32)
Calcium: 9.4 mg/dL (ref 8.4–10.5)
Chloride: 100 mEq/L (ref 96–112)
Creatinine, Ser: 0.9 mg/dL (ref 0.50–1.10)
GFR calc Af Amer: >90 mL/min (ref >90)
GFR calc non Af Amer: >90 mL/min (ref >90)
Glucose, Bld: 97 mg/dL (ref 70–99)
Potassium: 4.2 mEq/L (ref 3.7–5.3)
Sodium: 138 mEq/L (ref 137–147)

## 2014-02-01 LAB — URINALYSIS, ROUTINE W REFLEX MICROSCOPIC
Bilirubin Urine: NEGATIVE
Glucose, UA: NEGATIVE mg/dL
Hgb urine dipstick: NEGATIVE
Ketones, ur: NEGATIVE mg/dL
Nitrite: NEGATIVE
Protein, ur: NEGATIVE mg/dL
Specific Gravity, Urine: 1.007 (ref 1.005–1.030)
Urobilinogen, UA: 0.2 mg/dL (ref 0.0–1.0)
pH: 8.5 — ABNORMAL HIGH (ref 5.0–8.0)

## 2014-02-01 LAB — URINE MICROSCOPIC-ADD ON

## 2014-02-01 LAB — PREGNANCY, URINE: Preg Test, Ur: NEGATIVE

## 2014-02-01 MED ORDER — SODIUM CHLORIDE 0.9 % IV BOLUS (SEPSIS)
1000.0000 mL | Freq: Once | INTRAVENOUS | Status: AC
  Administered 2014-02-01: 1000 mL via INTRAVENOUS

## 2014-02-01 MED ORDER — ONDANSETRON HCL 4 MG/2ML IJ SOLN
4.0000 mg | Freq: Once | INTRAMUSCULAR | Status: AC
  Administered 2014-02-01: 4 mg via INTRAVENOUS
  Filled 2014-02-01: qty 2

## 2014-02-01 MED ORDER — ONDANSETRON HCL 4 MG PO TABS: 4.0000 mg | ORAL_TABLET | Freq: Three times a day (TID) | ORAL | Status: DC | PRN

## 2014-02-01 NOTE — ED Provider Notes (Signed)
CSN: [id number]     Arrival date & time 02/01/14  0806 History   First MD Initiated Contact with Patient 02/01/14 [phone]     Chief Complaint  Patient presents with  . Emesis  . Diarrhea     (Consider location/radiation/quality/duration/timing/severity/associated sxs/prior Treatment) Patient is a 23 y.o. female presenting with vomiting. The history is provided by the patient.  Emesis Severity:  Moderate Duration:  1 day Timing:  Intermittent Quality:  Stomach contents Able to tolerate:  Liquids and solids Progression:  Unchanged Chronicity:  New Recent urination:  Normal Context: not post-tussive and not self-induced   Relieved by:  None tried Worsened by:  Nothing tried Ineffective treatments:  None tried Associated symptoms: abdominal pain, chills, diarrhea and headaches   Associated symptoms: no fever, no myalgias, no sore throat and no URI   Abdominal pain:    Location:  Epigastric   Severity:  Moderate Diarrhea:    Quality:  Watery   Severity:  Moderate   Duration:  1 day   Timing:  Intermittent   Progression:  Unchanged Risk factors: not pregnant now   Risk factors comment:  Works at [employer] and had a hamburger and carrots last night.  LMP 01/13/2014 Not sexually active.   Past Medical History  Diagnosis Date  . GERD (gastroesophageal reflux disease)   . UTI (lower urinary tract infection)   . Ovarian cyst   . Dyspepsia     functional  . Benign neoplasm of eyelid including canthus   . Yeast infection   . Herpes simplex type 1 antibody positive   . Irritable bowel syndrome   . Anal fissure 10/09/2011    Symptoms began in February 2013. Diagnosed by rectal and anoscopic examination 10/09/2011. Diltiazem gel begun.   . Internal hemorrhoids 10/09/2011   Past Surgical History  Procedure Laterality Date  . Myringotomy      x2  . Tympanostomy    . Tonsillectomy    . Nasal sinus surgery    . Laparoscopy  12/19/10  . Upper gastrointestinal endoscopy   10/17/2010    normal  . Tympanoplasty  2000   Family History  Problem Relation Age of Onset  . Diabetes Mother   . Hypertension Mother   . Irritable bowel syndrome Father   . Breast cancer Maternal Grandmother   . Heart disease Maternal Grandfather   . Colon cancer Neg Hx   . Brain cancer Paternal Grandmother     tumor   History  Substance Use Topics  . Smoking status: Never Smoker   . Smokeless tobacco: Never Used  . Alcohol Use: Yes   OB History   Grav Para Term Preterm Abortions TAB SAB Ect Mult Living                 Review of Systems  Constitutional: Positive for chills.  HENT: Negative for sore throat.   Gastrointestinal: Positive for vomiting, abdominal pain and diarrhea.  Musculoskeletal: Negative for myalgias.  Neurological: Positive for headaches.      Allergies  Review of patient's allergies indicates no known allergies.  Home Medications   Prior to Admission medications   Medication Sig Start Date End Date Taking? Authorizing Provider  desogestrel-ethinyl estradiol (KARIVA,AZURETTE,MIRCETTE) 0.15-0.02/0.01 MG (21/5) tablet Take 1 tablet by mouth daily.      Historical Provider, MD  HYDROcodone-acetaminophen (NORCO/VICODIN) 5-325 MG per tablet Take 1-2 tablets by mouth every 6 (six) hours as needed for moderate pain. 07/15/13   John L Molpus,  MD  hyoscyamine (LEVSIN SL) 0.125 MG SL tablet Place 1 tablet (0.125 mg total) under the tongue every 4 (four) hours as needed. May take 2 12/17/13   Gatha Mayer, MD  hyoscyamine (LEVSIN/SL) 0.125 MG SL tablet Place 1 tablet (0.125 mg total) under the tongue every 4 (four) hours as needed for cramping. 11/10/10 11/20/10  Rosalita Chessman, DO  Multiple Vitamin (MULTIVITAMIN) capsule Take 1 capsule by mouth daily.      Historical Provider, MD  Multiple Vitamins-Minerals (HAIR/SKIN/NAILS PO) Take 1 tablet by mouth daily.      Historical Provider, MD  omeprazole-sodium bicarbonate (ZEGERID) 40-1100 MG per capsule Take 1  capsule by mouth at bedtime. 12/17/13   Gatha Mayer, MD  ondansetron (ZOFRAN ODT) 4 MG disintegrating tablet Take 1 tablet (4 mg total) by mouth every 8 (eight) hours as needed for nausea. 03/10/13   Glendell Docker, NP  ondansetron (ZOFRAN) 4 MG tablet Take 1 tablet (4 mg total) by mouth every 8 (eight) hours as needed for nausea or vomiting. 02/01/14   Cordelia Poche, MD  valACYclovir (VALTREX) 1000 MG tablet Take 1 tablet (1,000 mg total) by mouth daily. 03/10/13   Yvonne R Lowne, DO   BP 97/60  Pulse 63  Temp(Src) 98.5 F (36.9 C) (Oral)  Resp 18  SpO2 100% Physical Exam  Constitutional: She is oriented to person, place, and time. She appears well-developed and well-nourished.  HENT:  Head: Normocephalic.  Eyes: EOM are normal.  Cardiovascular: Normal rate, regular rhythm, normal heart sounds and normal pulses.   Pulmonary/Chest: Effort normal and breath sounds normal.  Abdominal: Soft. Normal appearance and bowel sounds are normal. There is tenderness. There is no rigidity, no rebound and no guarding.  Neurological: She is alert and oriented to person, place, and time.  Skin: Skin is warm and dry.  <2 second capillary refill    ED Course  Procedures (including critical care time) Medications  sodium chloride 0.9 % bolus 1,000 mL (0 mLs Intravenous Stopped 02/01/14 1043)  ondansetron (ZOFRAN) injection 4 mg (4 mg Intravenous Given 02/01/14 0907)   Labs Review Labs Reviewed  CBC WITH DIFFERENTIAL - Abnormal; Notable for the following:    WBC 15.5 (*)    Neutrophils Relative % 87 (*)    Neutro Abs 13.4 (*)    Lymphocytes Relative 8 (*)    All other components within normal limits  URINALYSIS, ROUTINE W REFLEX MICROSCOPIC - Abnormal; Notable for the following:    pH 8.5 (*)    Leukocytes, UA TRACE (*)    All other components within normal limits  URINE MICROSCOPIC-ADD ON - Abnormal; Notable for the following:    Squamous Epithelial / LPF FEW (*)    Bacteria, UA FEW (*)    All  other components within normal limits  URINE CULTURE  BASIC METABOLIC PANEL  PREGNANCY, URINE    Imaging Review No results found.   EKG Interpretation None      MDM   Final diagnoses:  Gastroenteritis   Patient hydrated and given zofran, which improved nausea. Patient tolerated PO challenge with water. WBC elevated to 15.5. Patient afebrile. BMP unremarkable. Urinalysis not suggestive of infection. Discussed plan for discharge home with zofran. Patient understood and agreed with plan. Urine culture pending. Patient stable for discharge home.    Cordelia Poche, MD 02/01/14 2124

## 2014-02-01 NOTE — Discharge Instructions (Signed)

## 2014-02-01 NOTE — ED Notes (Signed)
Patient woke up around 3 am with n/v/d that lasted until 5am

## 2014-02-02 LAB — URINE CULTURE
Colony Count: NO GROWTH
Culture: NO GROWTH

## 2014-02-04 NOTE — ED Provider Notes (Signed)
I saw and evaluated the patient, reviewed the resident's note and I agree with the findings and plan.   .Face to face Exam:  General:  Awake HEENT:  Atraumatic Resp:  Normal effort Abd:  Nondistended Neuro:No focal weakness  Dot Lanes, MD 02/04/14 1521

## 2014-03-12 ENCOUNTER — Telehealth: Payer: Self-pay | Admitting: Internal Medicine

## 2014-03-12 NOTE — Telephone Encounter (Signed)
Patient reports that she is having anxiety and can't sleep.  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  She is advised that she should discuss with Dr. Etter Sjogren her primary care.

## 2014-03-19 ENCOUNTER — Telehealth: Payer: Self-pay | Admitting: Family Medicine

## 2014-03-19 NOTE — Telephone Encounter (Signed)
Patient would like to transfer from Dr. Etter Sjogren to Debbrah Alar. Is this ok? Thanks!

## 2014-03-19 NOTE — Telephone Encounter (Signed)
Fine with me

## 2014-03-19 NOTE — Telephone Encounter (Signed)
OK with me.

## 2014-03-20 NOTE — Telephone Encounter (Signed)
Left detailed message informing patient of this. °

## 2014-03-31 ENCOUNTER — Encounter: Payer: Self-pay | Admitting: Internal Medicine

## 2014-04-07 ENCOUNTER — Ambulatory Visit: Payer: 59 | Admitting: Family

## 2014-04-14 ENCOUNTER — Encounter: Payer: Self-pay | Admitting: Family

## 2014-04-14 ENCOUNTER — Ambulatory Visit (INDEPENDENT_AMBULATORY_CARE_PROVIDER_SITE_OTHER): Payer: 59 | Admitting: Family

## 2014-04-14 VITALS — BP 100/80 | HR 68 | Temp 98.1°F | Resp 16 | Wt 125.2 lb

## 2014-04-14 DIAGNOSIS — R5381 Other malaise: Secondary | ICD-10-CM

## 2014-04-14 DIAGNOSIS — F341 Dysthymic disorder: Secondary | ICD-10-CM

## 2014-04-14 DIAGNOSIS — F32A Depression, unspecified: Secondary | ICD-10-CM

## 2014-04-14 DIAGNOSIS — R5383 Other fatigue: Secondary | ICD-10-CM | POA: Insufficient documentation

## 2014-04-14 DIAGNOSIS — Z0000 Encounter for general adult medical examination without abnormal findings: Secondary | ICD-10-CM

## 2014-04-14 DIAGNOSIS — R5382 Chronic fatigue, unspecified: Secondary | ICD-10-CM

## 2014-04-14 DIAGNOSIS — F329 Major depressive disorder, single episode, unspecified: Secondary | ICD-10-CM | POA: Insufficient documentation

## 2014-04-14 DIAGNOSIS — Z23 Encounter for immunization: Secondary | ICD-10-CM

## 2014-04-14 DIAGNOSIS — F419 Anxiety disorder, unspecified: Secondary | ICD-10-CM

## 2014-04-14 LAB — BASIC METABOLIC PANEL
BUN: 17 mg/dL (ref 6–23)
CO2: 26 mEq/L (ref 19–32)
Calcium: 9.4 mg/dL (ref 8.4–10.5)
Chloride: 102 mEq/L (ref 96–112)
Creatinine, Ser: 1 mg/dL (ref 0.4–1.2)
GFR: 77.17 mL/min (ref >60.00)
Glucose, Bld: 81 mg/dL (ref 70–99)
Potassium: 4.2 mEq/L (ref 3.5–5.1)
Sodium: 135 mEq/L (ref 135–145)

## 2014-04-14 LAB — TSH: TSH: 1.01 u[IU]/mL (ref 0.35–4.50)

## 2014-04-14 MED ORDER — CITALOPRAM HYDROBROMIDE 20 MG PO TABS: ORAL_TABLET | ORAL | Status: DC

## 2014-04-14 NOTE — Assessment & Plan Note (Signed)
Likely multifactorial- working 12 hour shifts, going to school, + depression.  Will obtain TSH to evaluate thyroid and CBC to evaluate for possible anemia. Start SSRI for depression.

## 2014-04-14 NOTE — Progress Notes (Signed)
Pre visit review using our clinic review tool, if applicable. No additional management support is needed unless otherwise documented below in the visit note. 

## 2014-04-14 NOTE — Assessment & Plan Note (Signed)
New.  Trial of citalopram. I instructed pt to start 1/2 tablet once daily for 1 week and then increase to a full tablet once daily on week two as tolerated.  We discussed common side effects such as nausea, drowsiness and weight gain.  Also discussed rare but serious side effect of suicide ideation.  She is instructed to discontinue medication go directly to ED if this occurs.  Pt verbalizes understanding.  Plan follow up in 1 month to evaluate progress.

## 2014-04-14 NOTE — Progress Notes (Signed)
Subjective:    Patient ID: Crystal [name], female    DOB: 1990-08-20, 23 y.o.   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 She is getting married 3 months before graduation. Feels "like crying sometimes.  Irritable.  Denies panic attacks.  Feels like things close in on her.  Becomes frustrated.  Both mom and dad are treated for anxiety/depression.    Pt transferring care. 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 Would like to discuss anxiety that has been present x 2 yrs since starting nursing school. Has increased fatigue x 6 months regardless of sleep.   Review of Systems See HPI  Past Medical History  Diagnosis Date  . GERD (gastroesophageal reflux disease)   . UTI (lower urinary tract infection)   . Ovarian cyst   . Dyspepsia     functional  . Benign neoplasm of eyelid including canthus   . Yeast infection   . Herpes simplex type 1 antibody positive   . Irritable bowel syndrome   . Anal fissure 10/09/2011    Symptoms began in February 2013. Diagnosed by rectal and anoscopic examination 10/09/2011. Diltiazem gel begun.   . Internal hemorrhoids 10/09/2011    History   Social History  . Marital Status: Single    Spouse Name: N/A    Number of Children: N/A  . Years of Education: N/A   Occupational History  . Not on file.   Social History Main Topics  . Smoking status: Never Smoker   . Smokeless tobacco: Never Used  . Alcohol Use: Yes     Comment: occasional  . Drug Use: No  . Sexual Activity: Yes    Birth Control/ Protection: Pill   Other Topics Concern  . 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 Myringotomy      x2  . Tympanostomy    . Tonsillectomy    . Nasal sinus surgery    . Laparoscopy  12/19/10   . Upper gastrointestinal endoscopy  10/17/2010    normal  . Tympanoplasty  2000    Family History  Problem Relation Age of Onset  . Diabetes Mother   . Hypertension Mother   . Anxiety disorder Mother   . Irritable bowel syndrome Father   . Depression Father   . Anxiety disorder Father   . Breast cancer Maternal Grandmother   . Heart disease Maternal Grandfather   . Colon cancer Neg Hx   . Brain cancer Paternal Grandmother     tumor    Allergies  Allergen Reactions  . Cymbalta [Duloxetine Hcl] Nausea Only    Current Outpatient Prescriptions on File Prior to Visit  Medication Sig Dispense Refill  . desogestrel-ethinyl estradiol (KARIVA,AZURETTE,MIRCETTE) 0.15-0.02/0.01 MG (21/5) tablet Take 1 tablet by mouth daily.        Marland Kitchen HYDROcodone-acetaminophen (NORCO/VICODIN) 5-325 MG per tablet Take 1-2 tablets by mouth every 6 (six) hours as needed for moderate pain.  20 tablet  0  . hyoscyamine (LEVSIN SL) 0.125 MG SL tablet Place 1 tablet (0.125 mg total) under the tongue every 4 (four) hours as needed. May take 2  90 tablet  2  .  Multiple Vitamin (MULTIVITAMIN) capsule Take 1 capsule by mouth daily.        . ondansetron (ZOFRAN ODT) 4 MG disintegrating tablet Take 1 tablet (4 mg total) by mouth every 8 (eight) hours as needed for nausea.  20 tablet  0  . valACYclovir (VALTREX) 1000 MG tablet Take 1 tablet (1,000 mg total) by mouth daily.  30 tablet  11   No current facility-administered medications on file prior to visit.    BP 100/80  Pulse 68  Temp(Src) 98.1 F (36.7 C) (Oral)  Resp 16  Wt 125 lb 3.2 oz (56.79 kg)  SpO2 96%  LMP 03/31/2014       Objective:   Physical Exam  Constitutional: She is oriented to person, place, and time. She appears well-developed and well-nourished. No distress.  Cardiovascular: Normal rate and regular rhythm.   No murmur heard. Pulmonary/Chest: Effort normal and breath sounds normal. No respiratory distress. She has no wheezes. She has no  rales. She exhibits no tenderness.  Neurological: She is alert and oriented to person, place, and time.  Psychiatric: Her behavior is normal. Judgment and thought content normal.  Mildly anxious          Assessment & Plan:  20 minutes spent with pt. >50% of this time was spent counseling pt on anxiety and depression.

## 2014-04-14 NOTE — Patient Instructions (Signed)
Start citalopram 1/2 tab by mouth once daily for 1 week, then increase to full tab once daily on week two. Please schedule a follow up appointment in 1 month.

## 2014-04-15 ENCOUNTER — Encounter: Payer: Self-pay | Admitting: Family

## 2014-04-16 LAB — QUANTIFERON TB GOLD ASSAY (BLOOD)
Interferon Gamma Release Assay: NEGATIVE
Mitogen value: >10 IU/mL
Quantiferon Nil Value: 0.02 IU/mL
Quantiferon Tb Ag Minus Nil Value: 0 IU/mL
TB Ag value: 0.02 IU/mL

## 2014-04-17 ENCOUNTER — Encounter: Payer: Self-pay | Admitting: Family

## 2014-05-18 ENCOUNTER — Ambulatory Visit (INDEPENDENT_AMBULATORY_CARE_PROVIDER_SITE_OTHER): Payer: 59 | Admitting: Family

## 2014-05-18 ENCOUNTER — Encounter: Payer: Self-pay | Admitting: Family

## 2014-05-18 VITALS — BP 90/60 | HR 66 | Temp 98.2°F | Resp 16 | Ht 66.0 in | Wt 127.6 lb

## 2014-05-18 DIAGNOSIS — F419 Anxiety disorder, unspecified: Principal | ICD-10-CM

## 2014-05-18 DIAGNOSIS — R590 Localized enlarged lymph nodes: Secondary | ICD-10-CM | POA: Insufficient documentation

## 2014-05-18 DIAGNOSIS — F418 Other specified anxiety disorders: Secondary | ICD-10-CM

## 2014-05-18 DIAGNOSIS — F32A Depression, unspecified: Secondary | ICD-10-CM

## 2014-05-18 DIAGNOSIS — F329 Major depressive disorder, single episode, unspecified: Secondary | ICD-10-CM

## 2014-05-18 MED ORDER — CITALOPRAM HYDROBROMIDE 20 MG PO TABS: ORAL_TABLET | ORAL | Status: DC

## 2014-05-18 NOTE — Progress Notes (Signed)
Pre visit review using our clinic review tool, if applicable. No additional management support is needed unless otherwise documented below in the visit note. 

## 2014-05-18 NOTE — Progress Notes (Signed)
Subjective:    Patient ID: Crystal [name], female    DOB: Aug 06, 1990, 23 y.o.   MRN: [id number]  HPI  Crystal [name] is a 23 yr old female who presents today for follow up of anxiety.  Last visit she reported increased stress/anxiety. She was given rx for citalopram. Reports tolerating well. Notes some increased yawning. Improved sleep. Tolerating stress.  Fatigue is better.    Left axillary LAD- x 1 week.  Resolved.    Review of Systems See HPI  Past Medical History  Diagnosis Date  . GERD (gastroesophageal reflux disease)   . UTI (lower urinary tract infection)   . Ovarian cyst   . Dyspepsia     functional  . Benign neoplasm of eyelid including canthus   . Yeast infection   . Herpes simplex type 1 antibody positive   . Irritable bowel syndrome   . Anal fissure 10/09/2011    Symptoms began in February 2013. Diagnosed by rectal and anoscopic examination 10/09/2011. Diltiazem gel begun.   . Internal hemorrhoids 10/09/2011    History   Social History  . Marital Status: Single    Spouse Name: N/A    Number of Children: N/A  . Years of Education: N/A   Occupational History  . Not on file.   Social History Main Topics  . Smoking status: Never Smoker   . Smokeless tobacco: Never Used  . Alcohol Use: Yes     Comment: occasional  . Drug Use: No  . Sexual Activity: Yes    Birth Control/ Protection: Pill   Other Topics Concern  . 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 Myringotomy      x2  . Tympanostomy    . Tonsillectomy    . Nasal sinus surgery    . Laparoscopy  12/19/10  . Upper gastrointestinal endoscopy  10/17/2010    normal  . Tympanoplasty  2000    Family History  Problem Relation Age of Onset  . Diabetes Mother   . Hypertension Mother   . Anxiety disorder Mother   .  Irritable bowel syndrome Father   . Depression Father   . Anxiety disorder Father   . Breast cancer Maternal Grandmother   . Heart disease Maternal Grandfather   . Colon cancer Neg Hx   . Brain cancer Paternal Grandmother     tumor    Allergies  Allergen Reactions  . Cymbalta [Duloxetine Hcl] Nausea Only    Current Outpatient Prescriptions on File Prior to Visit  Medication Sig Dispense Refill  . citalopram (CELEXA) 20 MG tablet 1/2 tab by mouth once daily for 1 week, then increase to a full tab once daily.  30 tablet  0  . desogestrel-ethinyl estradiol (KARIVA,AZURETTE,MIRCETTE) 0.15-0.02/0.01 MG (21/5) tablet Take 1 tablet by mouth daily.        Marland Kitchen HYDROcodone-acetaminophen (NORCO/VICODIN) 5-325 MG per tablet Take 1-2 tablets by mouth every 6 (six) hours as needed for moderate pain.  20 tablet  0  . hyoscyamine (LEVSIN SL) 0.125 MG SL tablet Place 1 tablet (0.125 mg total) under the tongue every 4 (four) hours as needed. May take 2  90 tablet  2  . Multiple Vitamin (MULTIVITAMIN) capsule Take 1 capsule  by mouth daily.        . ondansetron (ZOFRAN ODT) 4 MG disintegrating tablet Take 1 tablet (4 mg total) by mouth every 8 (eight) hours as needed for nausea.  20 tablet  0  . valACYclovir (VALTREX) 1000 MG tablet Take 1 tablet (1,000 mg total) by mouth daily.  30 tablet  11   No current facility-administered medications on file prior to visit.    BP 90/60  Pulse 66  Temp(Src) 98.2 F (36.8 C) (Oral)  Resp 16  Ht 5\' 6"  (1.676 m)  Wt 127 lb 9.6 oz (57.879 kg)  BMI 20.61 kg/m2  SpO2 99%  LMP 04/30/2014       Objective:   Physical Exam  Constitutional: She appears well-developed and well-nourished. No distress.  Cardiovascular: Normal rate and regular rhythm.   Pulmonary/Chest: Effort normal and breath sounds normal.  Psychiatric: She has a normal mood and affect. Her behavior is normal. Judgment and thought content normal.  Breast: bilateral breast exam is normal without  masses, bilateral axilla without lymphadenopathy        Assessment & Plan:

## 2014-05-18 NOTE — Assessment & Plan Note (Signed)
Resolved. Advised pt to contact us if recurrent enlarged lymph nodes.

## 2014-05-18 NOTE — Assessment & Plan Note (Signed)
Improved, continue citalopram.

## 2014-05-18 NOTE — Patient Instructions (Signed)
Continue citalopram.  Follow up in 3 months.

## 2014-07-09 ENCOUNTER — Encounter: Payer: Self-pay | Admitting: Gastroenterology

## 2014-07-09 ENCOUNTER — Ambulatory Visit (INDEPENDENT_AMBULATORY_CARE_PROVIDER_SITE_OTHER): Payer: 59 | Admitting: Gastroenterology

## 2014-07-09 VITALS — BP 100/58 | HR 72 | Ht 65.0 in | Wt 127.5 lb

## 2014-07-09 DIAGNOSIS — K589 Irritable bowel syndrome without diarrhea: Secondary | ICD-10-CM

## 2014-07-09 DIAGNOSIS — R1084 Generalized abdominal pain: Secondary | ICD-10-CM

## 2014-07-09 NOTE — Progress Notes (Signed)
     07/09/2014 Crystal [name] [id number] 12-26-1990   History of Present Illness:  This is a 23 year old female who is well known to Dr. Carlean Purl for her IBS/functional dyspepsia.  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  She presents to our office today with complaints of left sided abdominal pain that began this morning.  She says that she was sitting in class and started to have pain all along the left side of her abdomen.  She has moved her bowels; is passing flatus and belching.  No other symptoms.  It has subsided somewhat.  Has not tried her Levsin.    Current Medications, Allergies, Past Medical History, Past Surgical History, Family History and Social History were reviewed in [employer] record.   Physical Exam: BP 100/58 mmHg  Pulse 72  Ht 5\' 5"  (1.651 m)  Wt 127 lb 8 oz (57.834 kg)  BMI 21.22 kg/m2  LMP 06/30/2014 General: Well developed white female in no acute distress Head: Normocephalic and atraumatic Eyes:  Sclerae anicteric, conjunctiva pink  Ears: Normal auditory acuity Lungs: Clear throughout to auscultation Heart: Regular rate and rhythm Abdomen: Soft, non-distended.  Normal bowel sounds.  Mild left sided TTP without R/R/G. Musculoskeletal: Symmetrical with no gross deformities  Extremities: No edema  Neurological: Alert oriented x 4, grossly non-focal Psychological:  Alert and cooperative. Normal mood and affect  Assessment and Recommendations: -23 year old female with history of IBS/functional dyspepsia presenting with few hour history of abdominal pain.  Has not tried her Levsin.  I suspect that this is intestinal spasm/cramping related to her IBS and that it will subside.  No worrisome features at this point.  Would continue to monitor, drink a lot fluids and eat bland diet for the next day or so until pain resolves.  Try levsin.  Call back it pain worsens/persists, develops other symptoms such as fever,  vomiting, etc.

## 2014-07-09 NOTE — Progress Notes (Signed)
Agree with Ms. Zehr's management.  Carl E. Gessner, MD, FACG  

## 2014-07-09 NOTE — Patient Instructions (Signed)
Taken your Levsin as directed.  Drink lots of liquids.  Have a bland/low fiber diet for a couple of days.  Return to our office as needed.

## 2014-08-14 ENCOUNTER — Other Ambulatory Visit: Payer: Self-pay | Admitting: Family

## 2014-08-18 ENCOUNTER — Ambulatory Visit: Payer: 59 | Admitting: Family

## 2014-08-20 ENCOUNTER — Other Ambulatory Visit: Payer: Self-pay | Admitting: Family Medicine

## 2014-08-21 ENCOUNTER — Ambulatory Visit: Payer: 59 | Admitting: Family

## 2014-08-24 ENCOUNTER — Encounter: Payer: Self-pay | Admitting: Family

## 2014-08-24 ENCOUNTER — Ambulatory Visit (INDEPENDENT_AMBULATORY_CARE_PROVIDER_SITE_OTHER): Payer: 59 | Admitting: Family

## 2014-08-24 VITALS — BP 110/78 | HR 64 | Temp 98.2°F | Resp 18 | Ht 66.0 in | Wt 128.9 lb

## 2014-08-24 DIAGNOSIS — F418 Other specified anxiety disorders: Secondary | ICD-10-CM

## 2014-08-24 DIAGNOSIS — F329 Major depressive disorder, single episode, unspecified: Secondary | ICD-10-CM

## 2014-08-24 DIAGNOSIS — F32A Depression, unspecified: Secondary | ICD-10-CM

## 2014-08-24 DIAGNOSIS — F419 Anxiety disorder, unspecified: Principal | ICD-10-CM

## 2014-08-24 MED ORDER — CITALOPRAM HYDROBROMIDE 20 MG PO TABS
20.0000 mg | ORAL_TABLET | Freq: Every day | ORAL | Status: DC
Start: 2014-08-24 — End: 2015-07-29

## 2014-08-24 NOTE — Patient Instructions (Signed)
Continue citaloprom 20mg  once daily. Consider scheduling an appointment with one of our therapists. [phone] Follow up in 6 months.

## 2014-08-24 NOTE — Progress Notes (Signed)
Subjective:    Patient ID: Crystal [name], female    DOB: Sep 15, 1990, 24 y.o.   MRN: [id number]  HPI   Pt presents today for follow up.  Anxiety- reports increased stress with school. She is in Nursing school, in clinical, still having classes. Having trouble sleeping at night.  Planning a wedding.  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  Has some financial stress. She does feel that the citalopram is helping her anxiety- notes that she does reports that she does not get aggitated as easily.     Review of Systems    see HPI  Past Medical History  Diagnosis Date  . GERD (gastroesophageal reflux disease)   . UTI (lower urinary tract infection)   . Ovarian cyst   . Dyspepsia     functional  . Benign neoplasm of eyelid including canthus   . Yeast infection   . Herpes simplex type 1 antibody positive   . Irritable bowel syndrome   . Anal fissure 10/09/2011    Symptoms began in February 2013. Diagnosed by rectal and anoscopic examination 10/09/2011. Diltiazem gel begun.   . Internal hemorrhoids 10/09/2011    History   Social History  . Marital Status: Single    Spouse Name: N/A    Number of Children: N/A  . Years of Education: N/A   Occupational History  . Not on file.   Social History Main Topics  . Smoking status: Never Smoker   . Smokeless tobacco: Never Used  . Alcohol Use: Yes     Comment: occasional  . Drug Use: No  . Sexual Activity: Yes    Birth Control/ Protection: Pill   Other Topics Concern  . 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 Myringotomy      x2  . Tympanostomy    . Tonsillectomy    . Nasal sinus surgery    . Laparoscopy  12/19/10  . Upper gastrointestinal endoscopy  10/17/2010    normal  .  Tympanoplasty  2000    Family History  Problem Relation Age of Onset  . Diabetes Mother   . Hypertension Mother   . Anxiety disorder Mother   . Irritable bowel syndrome Father   . Depression Father   . Anxiety disorder Father   . Breast cancer Maternal Grandmother   . Heart disease Maternal Grandfather   . Colon cancer Neg Hx   . Brain cancer Paternal Grandmother     tumor    Allergies  Allergen Reactions  . Cymbalta [Duloxetine Hcl] Nausea Only    Current Outpatient Prescriptions on File Prior to Visit  Medication Sig Dispense Refill  . citalopram (CELEXA) 20 MG tablet TAKE 1 TABLET BY MOUTH DAILY 90 tablet 0  . desogestrel-ethinyl estradiol (KARIVA,AZURETTE,MIRCETTE) 0.15-0.02/0.01 MG (21/5) tablet Take 1 tablet by mouth daily.      Marland Kitchen HYDROcodone-acetaminophen (NORCO/VICODIN) 5-325 MG per tablet Take 1-2 tablets by mouth every 6 (six) hours as needed for moderate pain. 20 tablet 0  . hyoscyamine (LEVSIN SL) 0.125 MG SL tablet Place 1 tablet (0.125 mg total) under the tongue every 4 (four) hours as needed. May take 2 90 tablet  2  . Multiple Vitamin (MULTIVITAMIN) capsule Take 1 capsule by mouth daily.      . ondansetron (ZOFRAN ODT) 4 MG disintegrating tablet Take 1 tablet (4 mg total) by mouth every 8 (eight) hours as needed for nausea. 20 tablet 0  . valACYclovir (VALTREX) 1000 MG tablet TAKE 1 TABLET BY MOUTH ONCE DAILY 30 tablet 3   No current facility-administered medications on file prior to visit.    BP 110/78 mmHg  Pulse 64  Temp(Src) 98.2 F (36.8 C) (Oral)  Resp 18  Ht 5\' 6"  (1.676 m)  Wt 128 lb 14.4 oz (58.469 kg)  BMI 20.82 kg/m2  SpO2 96%  LMP 08/23/2014    Objective:   Physical Exam  Constitutional: She appears well-developed and well-nourished. No distress.  Psychiatric: Her behavior is normal. Judgment and thought content normal.  Mildly anxious appearing but pleasant          Assessment & Plan:

## 2014-08-24 NOTE — Progress Notes (Signed)
Pre visit review using our clinic review tool, if applicable. No additional management support is needed unless otherwise documented below in the visit note. 

## 2014-08-25 NOTE — Assessment & Plan Note (Signed)
Improved. She continues to have some situational anxiety,however I think this will improve after her wedding and school calms down. She is comfortable continuing current dose of citalopram.  Plan follow up in 6 months, sooner if problems/concerns.  15 min spent with pt today >50% of this time was spent counseling pt on her anxiety.

## 2014-10-01 ENCOUNTER — Encounter: Payer: Self-pay | Admitting: Medical

## 2014-10-01 ENCOUNTER — Ambulatory Visit (INDEPENDENT_AMBULATORY_CARE_PROVIDER_SITE_OTHER): Payer: 59 | Admitting: Medical

## 2014-10-01 VITALS — BP 128/84 | HR 81 | Temp 98.1°F | Ht 66.0 in | Wt 127.6 lb

## 2014-10-01 DIAGNOSIS — F988 Other specified behavioral and emotional disorders with onset usually occurring in childhood and adolescence: Secondary | ICD-10-CM | POA: Insufficient documentation

## 2014-10-01 DIAGNOSIS — F909 Attention-deficit hyperactivity disorder, unspecified type: Secondary | ICD-10-CM | POA: Diagnosis not present

## 2014-10-01 MED ORDER — PENICILLIN G BENZATHINE 1200000 UNIT/2ML IM SUSP: 1.2000 10*6.[IU] | Freq: Once | INTRAMUSCULAR | Status: DC

## 2014-10-01 NOTE — Progress Notes (Signed)
Subjective:    Patient ID: Crystal [name], female    DOB: Aug 26, 1990, 24 y.o.   MRN: [id number]  HPI   Pt in stating she had some concentration problems. This started about 6 months ago. Pt is in nursing school. She thinks she had problems concentrating in the past. But this is more noticeable now than with heavier work load.(previous work in school she was easier and did not require concentration) Pt was supposed to finish in September. Pt failed out of school. Pt appeal process to get back in school.  She describes easily distracted with any noise. Then if room quite her mind also wonders easily.   Even if she did not get back in the program. She feels would need to be on something for concentration.  Pt is on celexa for anxiety.   Pt states takes vicodin rarely. She has not taken one in about a year.  Pt did score high on ADHD self report scale. This will be scanned to chart.    Review of Systems  Constitutional: Negative for fever, chills and fatigue.  Respiratory: Negative for cough, chest tightness and wheezing.   Cardiovascular: Negative for chest pain and palpitations.  Hematological: Negative for adenopathy. Does not bruise/bleed easily.  Psychiatric/Behavioral: Positive for decreased concentration. Negative for suicidal ideas, hallucinations, behavioral problems, confusion, self-injury and dysphoric mood. The patient is nervous/anxious. The patient is not hyperactive.    Past Medical History  Diagnosis Date  . GERD (gastroesophageal reflux disease)   . UTI (lower urinary tract infection)   . Ovarian cyst   . Dyspepsia     functional  . Benign neoplasm of eyelid including canthus   . Yeast infection   . Herpes simplex type 1 antibody positive   . Irritable bowel syndrome   . Anal fissure 10/09/2011    Symptoms began in February 2013. Diagnosed by rectal and anoscopic examination 10/09/2011. Diltiazem gel begun.   . Internal hemorrhoids 10/09/2011    History    Social History  . Marital Status: Single    Spouse Name: N/A  . Number of Children: N/A  . Years of Education: N/A   Occupational History  . Not on file.   Social History Main Topics  . Smoking status: Never Smoker   . Smokeless tobacco: Never Used  . Alcohol Use: Yes     Comment: occasional  . Drug Use: No  . Sexual Activity: Yes    Birth Control/ Protection: Pill   Other Topics Concern  . 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 Myringotomy      x2  . Tympanostomy    . Tonsillectomy    . Nasal sinus surgery    . Laparoscopy  12/19/10  . Upper gastrointestinal endoscopy  10/17/2010    normal  . Tympanoplasty  2000    Family History  Problem Relation Age of Onset  . Diabetes Mother   . Hypertension Mother   . Anxiety disorder Mother   . Irritable bowel syndrome Father   . Depression Father   . Anxiety disorder Father   . Breast cancer Maternal Grandmother   . Heart disease Maternal Grandfather   . Colon cancer Neg Hx   . Brain cancer  Paternal Grandmother     tumor    Allergies  Allergen Reactions  . Cymbalta [Duloxetine Hcl] Nausea Only    Current Outpatient Prescriptions on File Prior to Visit  Medication Sig Dispense Refill  . citalopram (CELEXA) 20 MG tablet Take 1 tablet (20 mg total) by mouth daily. 90 tablet 0  . desogestrel-ethinyl estradiol (KARIVA,AZURETTE,MIRCETTE) 0.15-0.02/0.01 MG (21/5) tablet Take 1 tablet by mouth daily.      Marland Kitchen HYDROcodone-acetaminophen (NORCO/VICODIN) 5-325 MG per tablet Take 1-2 tablets by mouth every 6 (six) hours as needed for moderate pain. 20 tablet 0  . hyoscyamine (LEVSIN SL) 0.125 MG SL tablet Place 1 tablet (0.125 mg total) under the tongue every 4 (four) hours as needed. May take 2 90 tablet 2  . Multiple Vitamin (MULTIVITAMIN)  capsule Take 1 capsule by mouth daily.      . ondansetron (ZOFRAN ODT) 4 MG disintegrating tablet Take 1 tablet (4 mg total) by mouth every 8 (eight) hours as needed for nausea. 20 tablet 0  . valACYclovir (VALTREX) 1000 MG tablet TAKE 1 TABLET BY MOUTH ONCE DAILY 30 tablet 3   No current facility-administered medications on file prior to visit.    BP 128/84 mmHg  Pulse 81  Temp(Src) 98.1 F (36.7 C) (Oral)  Ht 5\' 6"  (1.676 m)  Wt 127 lb 9.6 oz (57.879 kg)  BMI 20.61 kg/m2  SpO2 99%  LMP 08/31/2014      Objective:   Physical Exam   General Mental Status- Alert. General Appearance- Not in acute distress.   Skin General: Color- Normal Color. Moisture- Normal Moisture.  Neck Carotid Arteries- Normal color. Moisture- Normal Moisture. No carotid bruits. No JVD.  Chest and Lung Exam Auscultation: Breath Sounds:-Normal.  Cardiovascular Auscultation:Rythm- Regular. Murmurs & Other Heart Sounds:Auscultation of the heart reveals- No Murmurs.    Neurologic Cranial Nerve exam:- CN III-XII intact(No nystagmus), symmetric smile. Drift Test:- No drift. Romberg Exam:- Negative.  Heal to Toe Gait exam:-Normal. Finger to Nose:- Normal/Intact Strength:- 5/5 equal and symmetric strength both upper and lower extremities.       Assessment & Plan:

## 2014-10-01 NOTE — Patient Instructions (Signed)
ADD (attention deficit disorder) By your score on ADD questioneer and recent difficulty with recent extreme problems  concentrating, I think will rx staterra. But first will see if can get sample pack from rep and then write rx. I think I will have update on samples by Monday. Also please call and give me update if you have entered back in the program.     Follow up 4 days by phone.  If I do get samples will follow up one month after starting.

## 2014-10-01 NOTE — Progress Notes (Signed)
Pre visit review using our clinic review tool, if applicable. No additional management support is needed unless otherwise documented below in the visit note. 

## 2014-10-01 NOTE — Assessment & Plan Note (Signed)
By your score on ADD questioneer and recent difficulty with recent extreme problems  concentrating, I think will rx staterra. But first will see if can get sample pack from rep and then write rx. I think I will have update on samples by Monday. Also please call and give me update if you have entered back in the program.

## 2014-10-02 ENCOUNTER — Ambulatory Visit: Payer: 59 | Admitting: Family

## 2014-10-02 ENCOUNTER — Telehealth: Payer: Self-pay | Admitting: Medical

## 2014-10-02 NOTE — Telephone Encounter (Signed)
I did call the rep and left message asking her to give sample of stattera starter pack. Will wait for call back.

## 2014-10-12 ENCOUNTER — Encounter: Payer: Self-pay | Admitting: Family

## 2014-10-13 NOTE — Telephone Encounter (Signed)
Pt can pick up strattera.

## 2014-10-13 NOTE — Telephone Encounter (Signed)
Called patient to inform her that Strattera samples are here for her to pick up. Patient states she will be here to pick up today.

## 2014-11-24 ENCOUNTER — Telehealth: Payer: Self-pay | Admitting: Internal Medicine

## 2014-11-24 NOTE — Telephone Encounter (Signed)
appt is scheduled with the patient for Friday at 1:30

## 2014-11-24 NOTE — Telephone Encounter (Signed)
Per mom is having rectal bleeding  Please contact patient and arrange an office visit  This week please?  Let me know.  Could be a new opening Fri 130 - see staff message

## 2014-11-27 ENCOUNTER — Encounter: Payer: Self-pay | Admitting: Internal Medicine

## 2014-11-27 ENCOUNTER — Ambulatory Visit (INDEPENDENT_AMBULATORY_CARE_PROVIDER_SITE_OTHER): Payer: 59 | Admitting: Internal Medicine

## 2014-11-27 VITALS — BP 104/80 | HR 76 | Ht 65.0 in | Wt 124.0 lb

## 2014-11-27 DIAGNOSIS — K648 Other hemorrhoids: Secondary | ICD-10-CM | POA: Diagnosis not present

## 2014-11-27 MED ORDER — HYDROCORTISONE ACETATE 25 MG RE SUPP: 25.0000 mg | Freq: Every day | RECTAL | Status: DC

## 2014-11-27 NOTE — Patient Instructions (Signed)
Today you have been given a hemorrhoid handout to read.   We have sent the following medications to your pharmacy for you to pick up at your convenience: Hydrocortisone suppositories  Call us in a week with an update please.   I appreciate the opportunity to care for you. Silvano Rusk, M.D., [facility]

## 2014-11-27 NOTE — Progress Notes (Signed)
   Subjective:    Patient ID: Crystal [name], female    DOB: 1991/02/24, 24 y.o.   MRN: [id number] Cc: rectal bleeding   HPI 4 d ago somewhat hard stool - had red blood with wiping. Then has had bright red blood into toilet and on stool. Stools ok - 1/d not hard or loose No rectal pain Medications, allergies, past medical history, past surgical history, family history and social history are reviewed and updated in the EMR.   Review of Systems Getting married in 1 month Now studying EMT/paramedic Some nausea and some fatigue    Objective:   Physical Exam BP 104/80 mmHg  Pulse 76  Ht 5\' 5"  (1.651 m)  Wt 124 lb (56.246 kg)  BMI 20.63 kg/m2  LMP 10/30/2014  Rectal: Patti Martinique, CMA present.  Anoderm inspection revealed NL findings Digital exam revealed normal resting tone No mass or rectocele present.   Anoscopy was performed with the patient in the left lateral decubitus position while a chaperone was present and revealed Grade 1 internal hemorrhoids - inflamed        Assessment & Plan:  Bleeding internal hemorrhoids Start Anusol HC suppositories nightly x 7 d then prn Call me in 1 week w/ update If not better flex sig likely

## 2014-11-27 NOTE — Assessment & Plan Note (Signed)
Start Anusol HC suppositories nightly x 7 d then prn Call me in 1 week w/ update If not better flex sig likely

## 2014-12-08 ENCOUNTER — Telehealth: Payer: Self-pay | Admitting: Internal Medicine

## 2014-12-08 NOTE — Telephone Encounter (Signed)
She reports that she is doing much better. No further constipation with dulcolax or rectal bleeding.

## 2015-03-01 ENCOUNTER — Other Ambulatory Visit: Payer: Self-pay | Admitting: Obstetrics and Gynecology

## 2015-03-02 LAB — CYTOLOGY - PAP

## 2015-05-01 ENCOUNTER — Encounter: Payer: Self-pay | Admitting: *Deleted

## 2015-05-01 ENCOUNTER — Emergency Department
Admission: EM | Admit: 2015-05-01 | Discharge: 2015-05-01 | Disposition: A | Discharge status: Home / Self Care | Payer: 59 | Source: Home / Self Care | Attending: Family Medicine | Admitting: Family Medicine

## 2015-05-01 DIAGNOSIS — J069 Acute upper respiratory infection, unspecified: Secondary | ICD-10-CM

## 2015-05-01 DIAGNOSIS — J029 Acute pharyngitis, unspecified: Secondary | ICD-10-CM

## 2015-05-01 LAB — POCT RAPID STREP A (OFFICE): Rapid Strep A Screen: NEGATIVE

## 2015-05-01 MED ORDER — BENZONATATE 200 MG PO CAPS: 200.0000 mg | ORAL_CAPSULE | Freq: Every day | ORAL | Status: DC

## 2015-05-01 NOTE — Discharge Instructions (Signed)
As cold symptoms develop, try the following: Take plain guaifenesin (1200mg  extended release tabs such as Mucinex) twice daily, with plenty of water, for cough and congestion.  May add Pseudoephedrine (30mg , one or two every 4 to 6 hours) for sinus congestion.  Get adequate rest.   May use Afrin nasal spray (or generic oxymetazoline) twice daily for about 5 days and then discontinue.  Also recommend using saline nasal spray several times daily and saline nasal irrigation (AYR is a common brand).  Use Flonase nasal spray each morning after using Afrin nasal spray and saline nasal irrigation. Try warm salt water gargles for sore throat.  Stop all antihistamines for now, and other non-prescription cough/cold preparations. May take Ibuprofen 200mg , 4 tabs every 8 hours with food for chest/sternum discomfort.   Follow-up with family doctor if not improving about10 days.

## 2015-05-01 NOTE — ED Notes (Signed)
Pt complains of sore throat headache chills and fevers that started yesterday.  She has taken Day cold and cough with little relief.

## 2015-05-01 NOTE — ED Provider Notes (Signed)
CSN: [id number]     Arrival date & time 05/01/15  1748 History   First MD Initiated Contact with Patient 05/01/15 1842     Chief Complaint  Patient presents with  . Sore Throat  . Chills     HPI Comments: Patient complains of one day history of typical cold-like symptoms including mild sore throat, sinus congestion, headache, fatigue, and cough.   The history is provided by the patient.    Past Medical History  Diagnosis Date  . GERD (gastroesophageal reflux disease)   . UTI (lower urinary tract infection)   . Ovarian cyst   . Dyspepsia     functional  . Benign neoplasm of eyelid including canthus   . Yeast infection   . Herpes simplex type 1 antibody positive   . Irritable bowel syndrome   . Anal fissure 10/09/2011    Symptoms began in February 2013. Diagnosed by rectal and anoscopic examination 10/09/2011. Diltiazem gel begun.   . Internal hemorrhoids 10/09/2011   Past Surgical History  Procedure Laterality Date  . Myringotomy      x2  . Tympanostomy    . Tonsillectomy    . Nasal sinus surgery    . Laparoscopy  12/19/10  . Upper gastrointestinal endoscopy  10/17/2010    normal  . Tympanoplasty  2000   Family History  Problem Relation Age of Onset  . Diabetes Mother   . Hypertension Mother   . Anxiety disorder Mother   . Irritable bowel syndrome Father   . Depression Father   . Anxiety disorder Father   . Breast cancer Maternal Grandmother   . Heart disease Maternal Grandfather   . Colon cancer Neg Hx   . Brain cancer Paternal Grandmother     tumor   Social History  Substance Use Topics  . Smoking status: Never Smoker   . Smokeless tobacco: Never Used  . Alcohol Use: Yes     Comment: occasional   OB History    No data available     Review of Systems + sore throat No cough No pleuritic pain No wheezing + nasal congestion + post-nasal drainage No sinus pain/pressure No itchy/red eyes No earache No hemoptysis No SOB + fever, + chills + nausea No  vomiting No abdominal pain No diarrhea No urinary symptoms No skin rash + fatigue No myalgias + headache Used OTC meds without relief  Allergies  Cymbalta  Home Medications   Prior to Admission medications   Medication Sig Start Date End Date Taking? Authorizing Provider  Ascorbic Acid (VITAMIN C) 100 MG tablet Take 100 mg by mouth daily.   Yes Historical Provider, MD  citalopram (CELEXA) 10 MG tablet Take 10 mg by mouth daily.   Yes Historical Provider, MD  Probiotic Product (PROBIOTIC DAILY PO) Take by mouth.   Yes Historical Provider, MD  benzonatate (TESSALON) 200 MG capsule Take 1 capsule (200 mg total) by mouth at bedtime. Take as needed for cough 05/01/15   Kandra Nicolas, MD  citalopram (CELEXA) 20 MG tablet Take 1 tablet (20 mg total) by mouth daily. 08/24/14   Debbrah Alar, NP  desogestrel-ethinyl estradiol (KARIVA,AZURETTE,MIRCETTE) 0.15-0.02/0.01 MG (21/5) tablet Take 1 tablet by mouth daily.      Historical Provider, MD  HYDROcodone-acetaminophen (NORCO/VICODIN) 5-325 MG per tablet Take 1-2 tablets by mouth every 6 (six) hours as needed for moderate pain. 07/15/13   John Molpus, MD  hydrocortisone (ANUSOL-HC) 25 MG suppository Place 1 suppository (25 mg total) rectally at  bedtime. Do for 7 nights the as needed 11/27/14   Gatha Mayer, MD  hyoscyamine (LEVSIN SL) 0.125 MG SL tablet Place 1 tablet (0.125 mg total) under the tongue every 4 (four) hours as needed. May take 2 12/17/13   Gatha Mayer, MD  Multiple Vitamin (MULTIVITAMIN) capsule Take 1 capsule by mouth daily.      Historical Provider, MD  ondansetron (ZOFRAN ODT) 4 MG disintegrating tablet Take 1 tablet (4 mg total) by mouth every 8 (eight) hours as needed for nausea. 03/10/13   Glendell Docker, NP  valACYclovir (VALTREX) 1000 MG tablet TAKE 1 TABLET BY MOUTH ONCE DAILY 08/20/14   Debbrah Alar, NP   Meds Ordered and Administered this Visit  Medications - No data to display  BP 115/79 mmHg  Pulse 78   Temp(Src) 97.9 F (36.6 C) (Oral)  Ht 5\' 6"  (1.676 m)  Wt 123 lb 8 oz (56.019 kg)  BMI 19.94 kg/m2  SpO2 100% No data found.   Physical Exam Nursing notes and Vital Signs reviewed. Appearance:  Patient appears stated age, and in no acute distress Eyes:  Pupils are equal, round, and reactive to light and accomodation.  Extraocular movement is intact.  Conjunctivae are not inflamed  Ears:  Canals normal.  Tympanic membranes normal.  Nose:  Mildly congested turbinates.  No sinus tenderness.  Pharynx:  Normal Neck:  Supple.  Tender shotty posterior nodes are palpated bilaterally  Lungs:  Clear to auscultation.  Breath sounds are equal.  Moving air well. Heart:  Regular rate and rhythm without murmurs, rubs, or gallops.  Abdomen:  Nontender without masses or hepatosplenomegaly.  Bowel sounds are present.  No CVA or flank tenderness.  Extremities:  No edema.  No calf tenderness Skin:  No rash present.   ED Course  Procedures  None  Lab Review: POCT rapid strep test negative   MDM   1. Acute pharyngitis, unspecified etiology   2. Viral URI; There is no evidence of bacterial infection today.       As cold symptoms develop, try the following: Take plain guaifenesin (1200mg  extended release tabs such as Mucinex) twice daily, with plenty of water, for cough and congestion.  May add Pseudoephedrine (30mg , one or two every 4 to 6 hours) for sinus congestion.  Get adequate rest.   May use Afrin nasal spray (or generic oxymetazoline) twice daily for about 5 days and then discontinue.  Also recommend using saline nasal spray several times daily and saline nasal irrigation (AYR is a common brand).  Use Flonase nasal spray each morning after using Afrin nasal spray and saline nasal irrigation. Try warm salt water gargles for sore throat.  Stop all antihistamines for now, and other non-prescription cough/cold preparations. May take Ibuprofen 200mg , 4 tabs every 8 hours with food for  chest/sternum discomfort.   Follow-up with family doctor if not improving about10 days.     Kandra Nicolas, MD 05/10/15 [phone]

## 2015-05-12 ENCOUNTER — Telehealth: Payer: Self-pay | Admitting: *Deleted

## 2015-07-29 ENCOUNTER — Encounter: Payer: Self-pay | Admitting: *Deleted

## 2015-07-29 ENCOUNTER — Emergency Department
Admission: EM | Admit: 2015-07-29 | Discharge: 2015-07-29 | Disposition: A | Discharge status: Home / Self Care | Payer: 59 | Source: Home / Self Care | Attending: Family Medicine | Admitting: Family Medicine

## 2015-07-29 DIAGNOSIS — M940 Chondrocostal junction syndrome [Tietze]: Secondary | ICD-10-CM | POA: Diagnosis not present

## 2015-07-29 DIAGNOSIS — B9789 Other viral agents as the cause of diseases classified elsewhere: Principal | ICD-10-CM

## 2015-07-29 DIAGNOSIS — J069 Acute upper respiratory infection, unspecified: Secondary | ICD-10-CM | POA: Diagnosis not present

## 2015-07-29 MED ORDER — GUAIFENESIN-CODEINE 100-10 MG/5ML PO SOLN: ORAL | Status: DC

## 2015-07-29 MED ORDER — AZITHROMYCIN 250 MG PO TABS: ORAL_TABLET | ORAL | Status: DC

## 2015-07-29 NOTE — ED Notes (Signed)
Pt c/o nonproductive cough, fatigue, HA, nasal congestion and chest discomfort x 4 days. Denies fever.

## 2015-07-29 NOTE — Discharge Instructions (Signed)
Take plain guaifenesin (1200mg  extended release tabs such as Mucinex) twice daily, with plenty of water, for cough and congestion.  May add Pseudoephedrine (30mg , one or two every 4 to 6 hours) for sinus congestion.  Get adequate rest.   May use Afrin nasal spray (or generic oxymetazoline) twice daily for about 5 days and then discontinue.  Also recommend using saline nasal spray several times daily and saline nasal irrigation (AYR is a common brand).  Try warm salt water gargles for sore throat.  Stop all antihistamines for now, and other non-prescription cough/cold preparations. May take Ibuprofen 200mg , 4 tabs every 8 hours with food for chest/sternum discomfort. Begin Azithromycin if not improving about one week or if persistent fever develops   Follow-up with family doctor if not improving about10 days.

## 2015-07-29 NOTE — ED Provider Notes (Signed)
CSN: [id number]     Arrival date & time 07/29/15  [id number] History   First MD Initiated Contact with Patient 07/29/15 1022     Chief Complaint  Patient presents with  . Cough      HPI Comments: Patient complains of three day history of typical cold-like symptoms including mild sore throat, sinus congestion, headache, fatigue, chills, and cough.  She has developed pressure-like sensation in anterior chest with deep inspiration.  The history is provided by the patient.    Past Medical History  Diagnosis Date  . GERD (gastroesophageal reflux disease)   . UTI (lower urinary tract infection)   . Ovarian cyst   . Dyspepsia     functional  . Benign neoplasm of eyelid including canthus   . Yeast infection   . Herpes simplex type 1 antibody positive   . Irritable bowel syndrome   . Anal fissure 10/09/2011    Symptoms began in February 2013. Diagnosed by rectal and anoscopic examination 10/09/2011. Diltiazem gel begun.   . Internal hemorrhoids 10/09/2011   Past Surgical History  Procedure Laterality Date  . Myringotomy      x2  . Tympanostomy    . Tonsillectomy    . Nasal sinus surgery    . Laparoscopy  12/19/10  . Upper gastrointestinal endoscopy  10/17/2010    normal  . Tympanoplasty  2000   Family History  Problem Relation Age of Onset  . Diabetes Mother   . Hypertension Mother   . Anxiety disorder Mother   . Irritable bowel syndrome Father   . Depression Father   . Anxiety disorder Father   . Breast cancer Maternal Grandmother   . Heart disease Maternal Grandfather   . Colon cancer Neg Hx   . Brain cancer Paternal Grandmother     tumor   Social History  Substance Use Topics  . Smoking status: Never Smoker   . Smokeless tobacco: Never Used  . Alcohol Use: Yes     Comment: occasional   OB History    No data available     Review of Systems + sore throat + cough No pleuritic pain but has tightness in anterior chest No wheezing + nasal congestion + post-nasal  drainage No sinus pain/pressure No itchy/red eyes ? earache No hemoptysis No SOB No fever, + chills No nausea No vomiting No abdominal pain No diarrhea No urinary symptoms No skin rash + fatigue + myalgias + headache Used OTC meds without relief  Allergies  Cymbalta  Home Medications   Prior to Admission medications   Medication Sig Start Date End Date Taking? Authorizing Provider  Ascorbic Acid (VITAMIN C) 100 MG tablet Take 100 mg by mouth daily.    Historical Provider, MD  azithromycin (ZITHROMAX Z-PAK) 250 MG tablet Take 2 tabs today; then begin one tab once daily for 4 more days. (Rx void after 08/08/15) 07/29/15   Kandra Nicolas, MD  citalopram (CELEXA) 10 MG tablet Take 10 mg by mouth daily.    Historical Provider, MD  desogestrel-ethinyl estradiol (KARIVA,AZURETTE,MIRCETTE) 0.15-0.02/0.01 MG (21/5) tablet Take 1 tablet by mouth daily.      Historical Provider, MD  guaiFENesin-codeine 100-10 MG/5ML syrup Take 44mL by mouth at bedtime as needed for cough 07/29/15   Kandra Nicolas, MD  Multiple Vitamin (MULTIVITAMIN) capsule Take 1 capsule by mouth daily.      Historical Provider, MD  Probiotic Product (PROBIOTIC DAILY PO) Take by mouth.    Historical Provider, MD  valACYclovir (  VALTREX) 1000 MG tablet TAKE 1 TABLET BY MOUTH ONCE DAILY 08/20/14   Debbrah Alar, NP   Meds Ordered and Administered this Visit  Medications - No data to display  BP 118/78 mmHg  Pulse 73  Temp(Src) 97.9 F (36.6 C) (Oral)  Resp 16  Ht 5\' 6"  (1.676 m)  Wt 127 lb (57.607 kg)  BMI 20.51 kg/m2  SpO2 99%  LMP 07/29/2015 No data found.   Physical Exam Nursing notes and Vital Signs reviewed. Appearance:  Patient appears stated age, and in no acute distress Eyes:  Pupils are equal, round, and reactive to light and accomodation.  Extraocular movement is intact.  Conjunctivae are not inflamed  Ears:  Canals normal.  Tympanic membranes normal.  Nose:  Mildly congested turbinates.  No  sinus tenderness.    Pharynx:  Normal Neck:  Supple.  Tender enlarged posterior nodes are palpated bilaterally  Lungs:  Clear to auscultation.  Breath sounds are equal.  Moving air well. Chest:  Distinct tenderness to palpation over the mid-sternum.  Heart:  Regular rate and rhythm without murmurs, rubs, or gallops.  Abdomen:  Nontender without masses or hepatosplenomegaly.  Bowel sounds are present.  No CVA or flank tenderness.  Extremities:  No edema.  No calf tenderness Skin:  No rash present.   ED Course  Procedures  none  MDM   1. Viral URI with cough   2. Costochondritis    There is no evidence of bacterial infection today.   Treat symptomatically for now.  Rx for Robitussin AC for night time cough.  Take plain guaifenesin (1200mg  extended release tabs such as Mucinex) twice daily, with plenty of water, for cough and congestion.  May add Pseudoephedrine (30mg , one or two every 4 to 6 hours) for sinus congestion.  Get adequate rest.   May use Afrin nasal spray (or generic oxymetazoline) twice daily for about 5 days and then discontinue.  Also recommend using saline nasal spray several times daily and saline nasal irrigation (AYR is a common brand).  Try warm salt water gargles for sore throat.  Stop all antihistamines for now, and other non-prescription cough/cold preparations. May take Ibuprofen 200mg , 4 tabs every 8 hours with food for chest/sternum discomfort. Begin Azithromycin if not improving about one week or if persistent fever develops (Given a prescription to hold, with an expiration date)  Follow-up with family doctor if not improving about10 days.     Kandra Nicolas, MD 07/29/15 [phone]

## 2015-07-31 ENCOUNTER — Telehealth: Payer: Self-pay | Admitting: Emergency Medicine

## 2015-09-03 ENCOUNTER — Emergency Department (HOSPITAL_BASED_OUTPATIENT_CLINIC_OR_DEPARTMENT_OTHER): Payer: 59

## 2015-09-03 ENCOUNTER — Encounter (HOSPITAL_BASED_OUTPATIENT_CLINIC_OR_DEPARTMENT_OTHER): Payer: Self-pay

## 2015-09-03 ENCOUNTER — Emergency Department (HOSPITAL_BASED_OUTPATIENT_CLINIC_OR_DEPARTMENT_OTHER)
Admission: EM | Admit: 2015-09-03 | Discharge: 2015-09-03 | Disposition: A | Discharge status: Home / Self Care | Payer: 59 | Attending: Emergency Medicine | Admitting: Emergency Medicine

## 2015-09-03 DIAGNOSIS — Z87440 Personal history of urinary (tract) infections: Secondary | ICD-10-CM | POA: Diagnosis not present

## 2015-09-03 DIAGNOSIS — Z8719 Personal history of other diseases of the digestive system: Secondary | ICD-10-CM | POA: Insufficient documentation

## 2015-09-03 DIAGNOSIS — Z8742 Personal history of other diseases of the female genital tract: Secondary | ICD-10-CM | POA: Insufficient documentation

## 2015-09-03 DIAGNOSIS — Z8619 Personal history of other infectious and parasitic diseases: Secondary | ICD-10-CM | POA: Insufficient documentation

## 2015-09-03 DIAGNOSIS — R05 Cough: Secondary | ICD-10-CM | POA: Diagnosis not present

## 2015-09-03 DIAGNOSIS — R509 Fever, unspecified: Secondary | ICD-10-CM | POA: Diagnosis not present

## 2015-09-03 DIAGNOSIS — J069 Acute upper respiratory infection, unspecified: Secondary | ICD-10-CM | POA: Insufficient documentation

## 2015-09-03 DIAGNOSIS — R0789 Other chest pain: Secondary | ICD-10-CM | POA: Insufficient documentation

## 2015-09-03 DIAGNOSIS — Z79899 Other long term (current) drug therapy: Secondary | ICD-10-CM | POA: Diagnosis not present

## 2015-09-03 DIAGNOSIS — Z85840 Personal history of malignant neoplasm of eye: Secondary | ICD-10-CM | POA: Insufficient documentation

## 2015-09-03 LAB — CBC WITH DIFFERENTIAL/PLATELET
Basophils Absolute: 0 10*3/uL (ref 0.0–0.1)
Basophils Relative: 0 %
Eosinophils Absolute: 0.2 10*3/uL (ref 0.0–0.7)
Eosinophils Relative: 2 %
HCT: 43.8 % (ref 36.0–46.0)
Hemoglobin: 14.9 g/dL (ref 12.0–15.0)
Lymphocytes Relative: 26 %
Lymphs Abs: 1.8 10*3/uL (ref 0.7–4.0)
MCH: 31.3 pg (ref 26.0–34.0)
MCHC: 34 g/dL (ref 30.0–36.0)
MCV: 92 fL (ref 78.0–100.0)
Monocytes Absolute: 0.9 10*3/uL (ref 0.1–1.0)
Monocytes Relative: 13 %
Neutro Abs: 4.2 10*3/uL (ref 1.7–7.7)
Neutrophils Relative %: 59 %
Platelets: 185 10*3/uL (ref 150–400)
RBC: 4.76 MIL/uL (ref 3.87–5.11)
RDW: 12.5 % (ref 11.5–15.5)
WBC: 7.1 10*3/uL (ref 4.0–10.5)

## 2015-09-03 LAB — BASIC METABOLIC PANEL
Anion gap: 9 (ref 5–15)
BUN: 15 mg/dL (ref 6–20)
CO2: 25 mmol/L (ref 22–32)
Calcium: 9.1 mg/dL (ref 8.9–10.3)
Chloride: 104 mmol/L (ref 101–111)
Creatinine, Ser: 0.88 mg/dL (ref 0.44–1.00)
GFR calc Af Amer: >60 mL/min (ref >60)
GFR calc non Af Amer: >60 mL/min (ref >60)
Glucose, Bld: 90 mg/dL (ref 65–99)
Potassium: 4.3 mmol/L (ref 3.5–5.1)
Sodium: 138 mmol/L (ref 135–145)

## 2015-09-03 LAB — RAPID STREP SCREEN (MED CTR MEBANE ONLY): Streptococcus, Group A Screen (Direct): NEGATIVE

## 2015-09-03 MED ORDER — ALBUTEROL SULFATE HFA 108 (90 BASE) MCG/ACT IN AERS
2.0000 | INHALATION_SPRAY | RESPIRATORY_TRACT | Status: DC | PRN
  Administered 2015-09-03: 2 via RESPIRATORY_TRACT
  Filled 2015-09-03: qty 6.7

## 2015-09-03 MED ORDER — BENZONATATE 100 MG PO CAPS: 100.0000 mg | ORAL_CAPSULE | Freq: Three times a day (TID) | ORAL | Status: DC

## 2015-09-03 MED ORDER — IBUPROFEN 800 MG PO TABS: 800.0000 mg | ORAL_TABLET | Freq: Three times a day (TID) | ORAL | Status: DC

## 2015-09-03 MED ORDER — BENZONATATE 100 MG PO CAPS
100.0000 mg | ORAL_CAPSULE | Freq: Once | ORAL | Status: AC
  Administered 2015-09-03: 100 mg via ORAL
  Filled 2015-09-03: qty 1

## 2015-09-03 MED ORDER — HYDROCOD POLST-CPM POLST ER 10-8 MG/5ML PO SUER: 5.0000 mL | Freq: Every evening | ORAL | Status: DC | PRN

## 2015-09-03 MED ORDER — ACETAMINOPHEN 500 MG PO TABS: 500.0000 mg | ORAL_TABLET | Freq: Four times a day (QID) | ORAL | Status: DC | PRN

## 2015-09-03 MED ORDER — OXYCODONE-ACETAMINOPHEN 5-325 MG PO TABS
2.0000 | ORAL_TABLET | Freq: Once | ORAL | Status: AC
  Administered 2015-09-03: 2 via ORAL
  Filled 2015-09-03: qty 2

## 2015-09-03 MED FILL — MAPAP 500 MG CAPLET: 500 | 25 days supply | Qty: 100 | Fill #0

## 2015-09-03 MED FILL — ONDANSETRON ODT 8 MG TABLET: 8 | 4 days supply | Qty: 15 | Fill #0

## 2015-09-03 MED FILL — HYDROCODONE-CHLORPHENIRAM S: 10-8 | 28 days supply | Qty: 140 | Fill #0

## 2015-09-03 MED FILL — IBUPROFEN 800 MG TABLET: 800 | 7 days supply | Qty: 21 | Fill #0

## 2015-09-03 MED FILL — BENZONATATE 100 MG CAPSULE: 100 | 7 days supply | Qty: 21 | Fill #0

## 2015-09-03 NOTE — ED Notes (Signed)
Family at bedside. 

## 2015-09-03 NOTE — ED Notes (Signed)
Prod cough, sore throat, fever x 2 days

## 2015-09-03 NOTE — Discharge Instructions (Signed)
1. Medications: tylenol and motrin for pain, tessalon and tussionex for cough, usual home medications 2. Treatment: rest, drink plenty of fluids 3. Follow Up: please followup with your primary doctor for discussion of your diagnoses and further evaluation after today's visit; if you do not have a primary care doctor use the resource guide provided to find one; please return to the ER for high fever, shortness of breath, new or worsening symptoms   Upper Respiratory Infection, Adult Most upper respiratory infections (URIs) are a viral infection of the air passages leading to the lungs. A URI affects the nose, throat, and upper air passages. The most common type of URI is nasopharyngitis and is typically referred to as "the common cold." URIs run their course and usually go away on their own. Most of the time, a URI does not require medical attention, but sometimes a bacterial infection in the upper airways can follow a viral infection. This is called a secondary infection. Sinus and middle ear infections are common types of secondary upper respiratory infections. Bacterial pneumonia can also complicate a URI. A URI can worsen asthma and chronic obstructive pulmonary disease (COPD). Sometimes, these complications can require emergency medical care and may be life threatening.  CAUSES Almost all URIs are caused by viruses. A virus is a type of germ and can spread from one person to another.  RISKS FACTORS You may be at risk for a URI if:   You smoke.   You have chronic heart or lung disease.  You have a weakened defense (immune) system.   You are very young or very old.   You have nasal allergies or asthma.  You work in crowded or poorly ventilated areas.  You work in health care facilities or schools. SIGNS AND SYMPTOMS  Symptoms typically develop 2-3 days after you come in contact with a cold virus. Most viral URIs last 7-10 days. However, viral URIs from the influenza virus (flu virus)  can last 14-18 days and are typically more severe. Symptoms may include:   Runny or stuffy (congested) nose.   Sneezing.   Cough.   Sore throat.   Headache.   Fatigue.   Fever.   Loss of appetite.   Pain in your forehead, behind your eyes, and over your cheekbones (sinus pain).  Muscle aches.  DIAGNOSIS  Your health care provider may diagnose a URI by:  Physical exam.  Tests to check that your symptoms are not due to another condition such as:  Strep throat.  Sinusitis.  Pneumonia.  Asthma. TREATMENT  A URI goes away on its own with time. It cannot be cured with medicines, but medicines may be prescribed or recommended to relieve symptoms. Medicines may help:  Reduce your fever.  Reduce your cough.  Relieve nasal congestion. HOME CARE INSTRUCTIONS   Take medicines only as directed by your health care provider.   Gargle warm saltwater or take cough drops to comfort your throat as directed by your health care provider.  Use a warm mist humidifier or inhale steam from a shower to increase air moisture. This may make it easier to breathe.  Drink enough fluid to keep your urine clear or pale yellow.   Eat soups and other clear broths and maintain good nutrition.   Rest as needed.   Return to work when your temperature has returned to normal or as your health care provider advises. You may need to stay home longer to avoid infecting others. You can also use a  face mask and careful hand washing to prevent spread of the virus.  Increase the usage of your inhaler if you have asthma.   Do not use any tobacco products, including cigarettes, chewing tobacco, or electronic cigarettes. If you need help quitting, ask your health care provider. PREVENTION  The best way to protect yourself from getting a cold is to practice good hygiene.   Avoid oral or hand contact with people with cold symptoms.   Wash your hands often if contact occurs.  There is  no clear evidence that vitamin C, vitamin E, echinacea, or exercise reduces the chance of developing a cold. However, it is always recommended to get plenty of rest, exercise, and practice good nutrition.  SEEK MEDICAL CARE IF:   You are getting worse rather than better.   Your symptoms are not controlled by medicine.   You have chills.  You have worsening shortness of breath.  You have brown or red mucus.  You have yellow or brown nasal discharge.  You have pain in your face, especially when you bend forward.  You have a fever.  You have swollen neck glands.  You have pain while swallowing.  You have white areas in the back of your throat. SEEK IMMEDIATE MEDICAL CARE IF:   You have severe or persistent:  Headache.  Ear pain.  Sinus pain.  Chest pain.  You have chronic lung disease and any of the following:  Wheezing.  Prolonged cough.  Coughing up blood.  A change in your usual mucus.  You have a stiff neck.  You have changes in your:  Vision.  Hearing.  Thinking.  Mood. MAKE SURE YOU:   Understand these instructions.  Will watch your condition.  Will get help right away if you are not doing well or get worse.   This information is not intended to replace advice given to you by your health care provider. Make sure you discuss any questions you have with your health care provider.   Document Released: 01/10/2001 Document Revised: 12/01/2014 Document Reviewed: 10/22/2013 Elsevier Interactive Patient Education [employer].

## 2015-09-03 NOTE — ED Provider Notes (Signed)
CSN: [id number]     Arrival date & time 09/03/15  1425 History   First MD Initiated Contact with Patient 09/03/15 1441     Chief Complaint  Patient presents with  . Cough    HPI   Crystal [name] is a 26 y.o. female with a PMH of GERD, IBS who presents to the ED with nasal congestion, cough productive of yellow sputum, sore throat, fever, and chills, which started yesterday and have been constant since that time. She denies exacerbating factors. She has not tried anything for symptom relief. She reports chest pain with coughing. She denies shortness of breath.   Past Medical History  Diagnosis Date  . GERD (gastroesophageal reflux disease)   . UTI (lower urinary tract infection)   . Ovarian cyst   . Dyspepsia     functional  . Benign neoplasm of eyelid including canthus   . Yeast infection   . Herpes simplex type 1 antibody positive   . Irritable bowel syndrome   . Anal fissure 10/09/2011    Symptoms began in February 2013. Diagnosed by rectal and anoscopic examination 10/09/2011. Diltiazem gel begun.   . Internal hemorrhoids 10/09/2011   Past Surgical History  Procedure Laterality Date  . Myringotomy      x2  . Tympanostomy    . Tonsillectomy    . Nasal sinus surgery    . Laparoscopy  12/19/10  . Upper gastrointestinal endoscopy  10/17/2010    normal  . Tympanoplasty  2000   Family History  Problem Relation Age of Onset  . Diabetes Mother   . Hypertension Mother   . Anxiety disorder Mother   . Irritable bowel syndrome Father   . Depression Father   . Anxiety disorder Father   . Breast cancer Maternal Grandmother   . Heart disease Maternal Grandfather   . Colon cancer Neg Hx   . Brain cancer Paternal Grandmother     tumor   Social History  Substance Use Topics  . Smoking status: Never Smoker   . Smokeless tobacco: Never Used  . Alcohol Use: Yes     Comment: occasional   OB History    No data available      Review of Systems  Constitutional: Positive for  fever and chills.  HENT: Positive for congestion and sore throat.   Respiratory: Positive for cough. Negative for shortness of breath.   Cardiovascular: Positive for chest pain.  Gastrointestinal: Negative for nausea, vomiting and abdominal pain.  All other systems reviewed and are negative.     Allergies  Cymbalta  Home Medications   Prior to Admission medications   Medication Sig Start Date End Date Taking? Authorizing Provider  guaiFENesin (MUCINEX) 600 MG 12 hr tablet Take by mouth 2 (two) times daily.   Yes Historical Provider, MD  acetaminophen (TYLENOL) 500 MG tablet Take 1 tablet (500 mg total) by mouth every 6 (six) hours as needed. 09/03/15   Marella Chimes, PA-C  benzonatate (TESSALON) 100 MG capsule Take 1 capsule (100 mg total) by mouth every 8 (eight) hours. 09/03/15   Marella Chimes, PA-C  chlorpheniramine-HYDROcodone (TUSSIONEX PENNKINETIC ER) 10-8 MG/5ML SUER Take 5 mLs by mouth at bedtime as needed for cough. 09/03/15   Marella Chimes, PA-C  desogestrel-ethinyl estradiol (KARIVA,AZURETTE,MIRCETTE) 0.15-0.02/0.01 MG (21/5) tablet Take 1 tablet by mouth daily.      Historical Provider, MD  ibuprofen (ADVIL,MOTRIN) 800 MG tablet Take 1 tablet (800 mg total) by mouth 3 (three) times  daily. 09/03/15   Marella Chimes, PA-C    BP 121/91 mmHg  Pulse 68  Temp(Src) 97.6 F (36.4 C) (Oral)  Resp 18  Ht 5\' 6"  (1.676 m)  Wt 57.607 kg  BMI 20.51 kg/m2  SpO2 100%  LMP 08/27/2015 Physical Exam  Constitutional: She is oriented to person, place, and time. She appears well-developed and well-nourished. No distress.  HENT:  Head: Normocephalic and atraumatic.  Right Ear: External ear normal.  Left Ear: External ear normal.  Nose: Nose normal.  Mouth/Throat: Uvula is midline, oropharynx is clear and moist and mucous membranes are normal.  Eyes: Conjunctivae, EOM and lids are normal. Pupils are equal, round, and reactive to light. Right eye exhibits no  discharge. Left eye exhibits no discharge. No scleral icterus.  Neck: Normal range of motion. Neck supple.  Cardiovascular: Normal rate, regular rhythm, normal heart sounds, intact distal pulses and normal pulses.   Pulmonary/Chest: Effort normal. No respiratory distress. She has wheezes. She has no rales. She exhibits tenderness.  Anterior chest wall tender to palpation, patient states this reproduces her pain. Wheezing to upper lung fields bilaterally.  Abdominal: Soft. Normal appearance and bowel sounds are normal. She exhibits no distension and no mass. There is no tenderness. There is no rigidity, no rebound and no guarding.  Musculoskeletal: Normal range of motion. She exhibits no edema or tenderness.  Neurological: She is alert and oriented to person, place, and time. She has normal strength. No sensory deficit.  Skin: Skin is warm, dry and intact. No rash noted. She is not diaphoretic. No erythema. No pallor.  Psychiatric: She has a normal mood and affect. Her speech is normal and behavior is normal.  Nursing note and vitals reviewed.   ED Course  Procedures (including critical care time)  Labs Review Labs Reviewed  RAPID STREP SCREEN (NOT AT [facility])  CBC WITH DIFFERENTIAL/PLATELET  BASIC METABOLIC PANEL    Imaging Review Dg Chest 2 View  09/03/2015  CLINICAL DATA:  Cough and fever.  Congestion. EXAM: CHEST  2 VIEW COMPARISON:  08/22/2013 FINDINGS: Normal mediastinum and cardiac silhouette. Normal pulmonary vasculature. No evidence of effusion, infiltrate, or pneumothorax. No acute bony abnormality. IMPRESSION: Normal chest radiograph. Electronically Signed   By: Suzy Bouchard M.D.   On: 09/03/2015 15:08   I have personally reviewed and evaluated these images and lab results as part of my medical decision-making.   EKG Interpretation None      MDM   Final diagnoses:  URI (upper respiratory infection)    25 year old female presents with nasal congestion, cough  productive of yellow mucous, sore throat, fever, and chills. Reports chest pain with cough. Denies shortness of breath. 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  Patient is afebrile. Vital signs stable. Heart RRR. Wheezing to upper lung fields bilaterally. Anterior chest wall TTP, patient states this reproduces her pain. Abdomen soft, non-tender, non-distended. Patient moves all extremities without difficulty.  CBC negative for leukocytosis or anemia. BMP unremarkable. Rapid strep negative. Chest x-ray no effusion, infiltrate, pnuemothorax. Patient given pain medication, cough medication, and albuterol for symptoms.  On reassessment of patient, she reports symptom improvement. Patient is nontoxic and well appearing, feel she is stable for discharge at this time. Symptoms likely viral. Advised to take tylenol and ibuprofen for pain. Will prescribe cough medication. Patient to follow-up with PCP. Return precautions discussed. Patient verbalizes her understanding and is negative with plan.  BP 121/91 mmHg  Pulse 68  Temp(Src) 97.6 F (36.4 C) (Oral)  Resp 18  Ht 5\' 6"  (1.676 m)  Wt 57.607 kg  BMI 20.51 kg/m2  SpO2 100%  LMP 08/27/2015         Marella Chimes, PA-C 09/03/15 [place], MD 09/06/15 [phone]

## 2015-09-06 LAB — CULTURE, GROUP A STREP (THRC)

## 2015-10-22 MED FILL — LEVONOR-ETH ESTRAD 0.1-0.02: 0.1-20 | 28 days supply | Qty: 28 | Fill #4

## 2015-11-11 MED FILL — LEVONOR-ETH ESTRAD 0.1-0.02: 0.1-20 | 84 days supply | Qty: 84 | Fill #0

## 2015-11-18 ENCOUNTER — Encounter: Payer: Self-pay | Admitting: Family Medicine

## 2015-11-18 ENCOUNTER — Ambulatory Visit (INDEPENDENT_AMBULATORY_CARE_PROVIDER_SITE_OTHER): Payer: 59 | Admitting: Family Medicine

## 2015-11-18 VITALS — BP 131/90 | HR 73 | Ht 66.0 in | Wt 126.0 lb

## 2015-11-18 DIAGNOSIS — M25561 Pain in right knee: Secondary | ICD-10-CM | POA: Diagnosis not present

## 2015-11-18 NOTE — Progress Notes (Signed)
PCP: Nance Pear., NP  Subjective:   HPI: Patient is a 25 y.o. female here for right knee pain.  Patient reports she's had over 3 months of right knee pain. She remotely had problems with the same knee - had MRIs in 08/2007 and 07/2008 only showing ganglion cyst in distal femur - otherwise normal. Current pain is 6/10 level, sharp. Has been constant th epast 1 1/2 weeks anteriorly. Feels like this is medial and under the kneecap. Worse with prolonged flexion, sitting. Can wake her up at nighttime. No swelling or bruising. Also worse with running. No skin changes, fever, other joint issues.  Past Medical History  Diagnosis Date  . GERD (gastroesophageal reflux disease)   . UTI (lower urinary tract infection)   . Ovarian cyst   . Dyspepsia     functional  . Benign neoplasm of eyelid including canthus   . Yeast infection   . Herpes simplex type 1 antibody positive   . Irritable bowel syndrome   . Anal fissure 10/09/2011    Symptoms began in February 2013. Diagnosed by rectal and anoscopic examination 10/09/2011. Diltiazem gel begun.   . Internal hemorrhoids 10/09/2011    Current Outpatient Prescriptions on File Prior to Visit  Medication Sig Dispense Refill  . desogestrel-ethinyl estradiol (KARIVA,AZURETTE,MIRCETTE) 0.15-0.02/0.01 MG (21/5) tablet Take 1 tablet by mouth daily.       No current facility-administered medications on file prior to visit.    Past Surgical History  Procedure Laterality Date  . Myringotomy      x2  . Tympanostomy    . Tonsillectomy    . Nasal sinus surgery    . Laparoscopy  12/19/10  . Upper gastrointestinal endoscopy  10/17/2010    normal  . Tympanoplasty  2000    Allergies  Allergen Reactions  . Cymbalta [Duloxetine Hcl] Nausea Only    Social History   Social History  . Marital Status: Single    Spouse Name: N/A  . Number of Children: N/A  . Years of Education: N/A   Occupational History  . Not on file.   Social  History Main Topics  . Smoking status: Never Smoker   . Smokeless tobacco: Never Used  . Alcohol Use: 0.0 oz/week    0 Standard drinks or equivalent per week     Comment: occasional  . Drug Use: No  . Sexual Activity: Yes    Birth Control/ Protection: Pill   Other Topics Concern  . Not on file   Social History Narrative   Studying for EMT/paramedic at [employer]   parents who are both [place] RN's   Works at [employer] with fiancee          Family History  Problem Relation Age of Onset  . Diabetes Mother   . Hypertension Mother   . Anxiety disorder Mother   . Irritable bowel syndrome Father   . Depression Father   . Anxiety disorder Father   . Breast cancer Maternal Grandmother   . Heart disease Maternal Grandfather   . Colon cancer Neg Hx   . Brain cancer Paternal Grandmother     tumor    BP 131/90 mmHg  Pulse 73  Ht 5\' 6"  (1.676 m)  Wt 126 lb (57.153 kg)  BMI 20.35 kg/m2  Review of Systems: See HPI above.    Objective:  Physical Exam:  Gen: NAD, comfortable in exam room  Right knee: Minimal overpronation and mild VMO atrophy. No gross deformity, ecchymoses,  swelling. Mild TTP lateral patellar facet only.  No joint line, other tenderness. FROM. Negative ant/post drawers. Negative valgus/varus testing. Negative lachmanns. Negative mcmurrays, apleys, patellar apprehension. 5/5 strength hip abduction. NV intact distally.  Left knee: FROM without pain.     Assessment & Plan:  1. Right knee pain - Consistent with patellofemoral syndrome.  Her exam is reassuring.  Shown home exercises to do daily, discussed arch supports.  Icing, tylenol/nsaids if needed.  Avoid deep squats, lunges, other painful activities.  Knee brace as needed for support.  F/u in 6 weeks.  Consider physical therapy if not improving.

## 2015-11-18 NOTE — Patient Instructions (Signed)
You have patellofemoral syndrome Avoid painful activities when possible (often deep squats, lunges bother this) Cross train with swimming, cycling with low resistance, elliptical if needed. Straight leg raise, hip side raises, straight leg raises with foot turned outwards 3 sets of 10 once a day. Add ankle weight if thesee become too easy. Consider formal physical therapy Correct foot breakdown with something like dr. Zoe Lan active series. Avoid flat shoes, barefoot walking as much as possible the next 6 weeks. Icing 15 minutes at a time 3-4 times a day as needed. Tylenol or aleve as needed for pain Follow up with me in 6 weeks. Knee brace as needed for support when up and walking around.

## 2015-11-19 DIAGNOSIS — M25561 Pain in right knee: Secondary | ICD-10-CM | POA: Insufficient documentation

## 2015-11-19 NOTE — Assessment & Plan Note (Signed)
Consistent with patellofemoral syndrome.  Her exam is reassuring.  Shown home exercises to do daily, discussed arch supports.  Icing, tylenol/nsaids if needed.  Avoid deep squats, lunges, other painful activities.  Knee brace as needed for support.  F/u in 6 weeks.  Consider physical therapy if not improving.

## 2015-12-03 ENCOUNTER — Encounter: Payer: Self-pay | Admitting: Family

## 2015-12-03 ENCOUNTER — Ambulatory Visit (INDEPENDENT_AMBULATORY_CARE_PROVIDER_SITE_OTHER): Payer: 59 | Admitting: Family

## 2015-12-03 VITALS — BP 108/72 | HR 66 | Temp 97.8°F | Resp 18 | Ht 66.0 in | Wt 127.0 lb

## 2015-12-03 DIAGNOSIS — Z0000 Encounter for general adult medical examination without abnormal findings: Secondary | ICD-10-CM

## 2015-12-03 LAB — LIPID PANEL
Cholesterol: 144 mg/dL (ref 0–200)
HDL: 62 mg/dL (ref >39.00)
LDL Cholesterol: 66 mg/dL (ref 0–99)
NonHDL: 81.64
Total CHOL/HDL Ratio: 2
Triglycerides: 78 mg/dL (ref 0.0–149.0)
VLDL: 15.6 mg/dL (ref 0.0–40.0)

## 2015-12-03 LAB — URINALYSIS, ROUTINE W REFLEX MICROSCOPIC
Bilirubin Urine: NEGATIVE
Hgb urine dipstick: NEGATIVE
Ketones, ur: NEGATIVE
Leukocytes, UA: NEGATIVE
Nitrite: NEGATIVE
RBC / HPF: NONE SEEN (ref 0–?)
Specific Gravity, Urine: 1.01 (ref 1.000–1.030)
Total Protein, Urine: NEGATIVE
Urine Glucose: NEGATIVE
Urobilinogen, UA: 0.2 (ref 0.0–1.0)
WBC, UA: NONE SEEN (ref 0–?)
pH: 8 (ref 5.0–8.0)

## 2015-12-03 LAB — TSH: TSH: 2.33 u[IU]/mL (ref 0.35–4.50)

## 2015-12-03 LAB — HEPATIC FUNCTION PANEL
ALT: 20 U/L (ref 0–35)
AST: 28 U/L (ref 0–37)
Albumin: 4.2 g/dL (ref 3.5–5.2)
Alkaline Phosphatase: 39 U/L (ref 39–117)
Bilirubin, Direct: 0.1 mg/dL (ref 0.0–0.3)
Total Bilirubin: 0.6 mg/dL (ref 0.2–1.2)
Total Protein: 7 g/dL (ref 6.0–8.3)

## 2015-12-03 MED ORDER — CITALOPRAM HYDROBROMIDE 20 MG PO TABS: 20.0000 mg | ORAL_TABLET | Freq: Every day | ORAL | Status: DC

## 2015-12-03 MED FILL — CITALOPRAM HBR 20 MG TABLET: 20 | 30 days supply | Qty: 30 | Fill #0

## 2015-12-03 NOTE — Progress Notes (Signed)
Subjective:    Patient ID: Crystal [name], female    DOB: 05/03/1991, 25 y.o.   MRN: [id number]  HPI  [name] is a 25 yr old female who presents today for cpx.  Immunizations: tetanus 2014 Diet: healthy Exercise: trying to work out more Pap: 8/16 Dental: up to date Vision: up to date  Review of Systems  Constitutional: Negative for unexpected weight change.  HENT: Negative for rhinorrhea.   Eyes: Negative for visual disturbance.  Respiratory: Negative for cough.   Cardiovascular: Negative for leg swelling.  Gastrointestinal: Negative for diarrhea.       IBS with occasional constipation No recent blood from hemorrhoids noted.   Genitourinary: Negative for dysuria and menstrual problem.  Musculoskeletal: Negative for arthralgias.       Knee pain- will see Dr. Maureen Ralphs  Neurological: Negative for headaches.  Hematological: Negative for adenopathy.  Psychiatric/Behavioral:       Reports depression/anxiety is improved.      Past Medical History  Diagnosis Date  . GERD (gastroesophageal reflux disease)   . UTI (lower urinary tract infection)   . Ovarian cyst   . Dyspepsia     functional  . Benign neoplasm of eyelid including canthus   . Yeast infection   . Herpes simplex type 1 antibody positive   . Irritable bowel syndrome   . Anal fissure 10/09/2011    Symptoms began in February 2013. Diagnosed by rectal and anoscopic examination 10/09/2011. Diltiazem gel begun.   . Internal hemorrhoids 10/09/2011     Social History   Social History  . Marital Status: Single    Spouse Name: N/A  . Number of Children: N/A  . Years of Education: N/A   Occupational History  . Not on file.   Social History Main Topics  . Smoking status: Never Smoker   . Smokeless tobacco: Never Used  . Alcohol Use: 0.0 oz/week    0 Standard drinks or equivalent per week     Comment: occasional  . Drug Use: No  . Sexual Activity: Yes    Birth Control/ Protection: Pill   Other Topics  Concern  . Not on file   Social History Narrative   Studying for EMT/paramedic at [employer]   parents who are both [place] RN's   Works at [employer] with fiancee          Past Surgical History  Procedure Laterality Date  . Myringotomy      x2  . Tympanostomy    . Tonsillectomy    . Nasal sinus surgery    . Laparoscopy  12/19/10  . Upper gastrointestinal endoscopy  10/17/2010    normal  . Tympanoplasty  2000    Family History  Problem Relation Age of Onset  . Diabetes Mother   . Hypertension Mother   . Anxiety disorder Mother   . Irritable bowel syndrome Father   . Depression Father   . Anxiety disorder Father   . Breast cancer Maternal Grandmother   . Heart disease Maternal Grandfather   . Colon cancer Neg Hx   . Brain cancer Paternal Grandmother     tumor    Allergies  Allergen Reactions  . Cymbalta [Duloxetine Hcl] Nausea Only    Current Outpatient Prescriptions on File Prior to Visit  Medication Sig Dispense Refill  . desogestrel-ethinyl estradiol (KARIVA,AZURETTE,MIRCETTE) 0.15-0.02/0.01 MG (21/5) tablet Take 1 tablet by mouth daily.       No current facility-administered medications on file  prior to visit.    BP 108/72 mmHg  Pulse 66  Temp(Src) 97.8 F (36.6 C) (Oral)  Resp 18  Ht 5\' 6"  (1.676 m)  Wt 127 lb (57.607 kg)  BMI 20.51 kg/m2  SpO2 100%  LMP 11/15/2015       Objective:   Physical Exam Physical Exam  Constitutional: She is oriented to person, place, and time. She appears well-developed and well-nourished. No distress.  HENT:  Head: Normocephalic and atraumatic.  Right Ear: Tympanic membrane and ear canal normal.  Left Ear: Tympanic membrane and ear canal normal.  Mouth/Throat: Oropharynx is clear and moist.  Eyes: Pupils are equal, round, and reactive to light. No scleral icterus.  Neck: Normal range of motion. No thyromegaly present.  Cardiovascular: Normal rate and regular rhythm.   No murmur heard. Pulmonary/Chest:  Effort normal and breath sounds normal. No respiratory distress. He has no wheezes. She has no rales. She exhibits no tenderness.  Abdominal: Soft. Bowel sounds are normal. He exhibits no distension and no mass. There is no tenderness. There is no rebound and no guarding.  Musculoskeletal: She exhibits no edema.  Lymphadenopathy:    She has no cervical adenopathy.  Neurological: She is alert and oriented to person, place, and time. She has normal patellar reflexes. She exhibits normal muscle tone. Coordination normal.  Skin: Skin is warm and dry.  Psychiatric: She has a normal mood and affect. Her behavior is normal. Judgment and thought content normal.  Breast/pelvic: deferred to GYN         Assessment & Plan:          Assessment & Plan:

## 2015-12-03 NOTE — Progress Notes (Signed)
Pre visit review using our clinic review tool, if applicable. No additional management support is needed unless otherwise documented below in the visit note. 

## 2015-12-03 NOTE — Assessment & Plan Note (Signed)
Discussed continuing healthy diet, exercise. Obtain routine lab work. Follow up in 6 months.

## 2015-12-03 NOTE — Patient Instructions (Addendum)
Please complete lab work prior to leaving.  Continue healthy diet and exercise.  Preventive Care for Adults, Female A healthy lifestyle and preventive care can promote health and wellness. Preventive health guidelines for women include the following key practices.  A routine yearly physical is a good way to check with your health care provider about your health and preventive screening. It is a chance to share any concerns and updates on your health and to receive a thorough exam.  Visit your dentist for a routine exam and preventive care every 6 months. Brush your teeth twice a day and floss once a day. Good oral hygiene prevents tooth decay and gum disease.  The frequency of eye exams is based on your age, health, family medical history, use of contact lenses, and other factors. Follow your health care provider's recommendations for frequency of eye exams.  Eat a healthy diet. Foods like vegetables, fruits, whole grains, low-fat dairy products, and lean protein foods contain the nutrients you need without too many calories. Decrease your intake of foods high in solid fats, added sugars, and salt. Eat the right amount of calories for you.Get information about a proper diet from your health care provider, if necessary.  Regular physical exercise is one of the most important things you can do for your health. Most adults should get at least 150 minutes of moderate-intensity exercise (any activity that increases your heart rate and causes you to sweat) each week. In addition, most adults need muscle-strengthening exercises on 2 or more days a week.  Maintain a healthy weight. The body mass index (BMI) is a screening tool to identify possible weight problems. It provides an estimate of body fat based on height and weight. Your health care provider can find your BMI and can help you achieve or maintain a healthy weight.For adults 20 years and older:  A BMI below 18.5 is considered underweight.  A  BMI of 18.5 to 24.9 is normal.  A BMI of 25 to 29.9 is considered overweight.  A BMI of 30 and above is considered obese.  Maintain normal blood lipids and cholesterol levels by exercising and minimizing your intake of saturated fat. Eat a balanced diet with plenty of fruit and vegetables. Blood tests for lipids and cholesterol should begin at age 54 and be repeated every 5 years. If your lipid or cholesterol levels are high, you are over 50, or you are at high risk for heart disease, you may need your cholesterol levels checked more frequently.Ongoing high lipid and cholesterol levels should be treated with medicines if diet and exercise are not working.  If you smoke, find out from your health care provider how to quit. If you do not use tobacco, do not start.  Lung cancer screening is recommended for adults aged 65-80 years who are at high risk for developing lung cancer because of a history of smoking. A yearly low-dose CT scan of the lungs is recommended for people who have at least a 30-pack-year history of smoking and are a current smoker or have quit within the past 15 years. A pack year of smoking is smoking an average of 1 pack of cigarettes a day for 1 year (for example: 1 pack a day for 30 years or 2 packs a day for 15 years). Yearly screening should continue until the smoker has stopped smoking for at least 15 years. Yearly screening should be stopped for people who develop a health problem that would prevent them from having lung  cancer treatment.  If you are pregnant, do not drink alcohol. If you are breastfeeding, be very cautious about drinking alcohol. If you are not pregnant and choose to drink alcohol, do not have more than 1 drink per day. One drink is considered to be 12 ounces (355 mL) of beer, 5 ounces (148 mL) of wine, or 1.5 ounces (44 mL) of liquor.  Avoid use of street drugs. Do not share needles with anyone. Ask for help if you need support or instructions about stopping  the use of drugs.  High blood pressure causes heart disease and increases the risk of stroke. Your blood pressure should be checked at least every 1 to 2 years. Ongoing high blood pressure should be treated with medicines if weight loss and exercise do not work.  If you are 60-46 years old, ask your health care provider if you should take aspirin to prevent strokes.  Diabetes screening is done by taking a blood sample to check your blood glucose level after you have not eaten for a certain period of time (fasting). If you are not overweight and you do not have risk factors for diabetes, you should be screened once every 3 years starting at age 78. If you are overweight or obese and you are 75-24 years of age, you should be screened for diabetes every year as part of your cardiovascular risk assessment.  Breast cancer screening is essential preventive care for women. You should practice "breast self-awareness." This means understanding the normal appearance and feel of your breasts and may include breast self-examination. Any changes detected, no matter how small, should be reported to a health care provider. Women in their 64s and 30s should have a clinical breast exam (CBE) by a health care provider as part of a regular health exam every 1 to 3 years. After age 38, women should have a CBE every year. Starting at age 35, women should consider having a mammogram (breast X-ray test) every year. Women who have a family history of breast cancer should talk to their health care provider about genetic screening. Women at a high risk of breast cancer should talk to their health care providers about having an MRI and a mammogram every year.  Breast cancer gene (BRCA)-related cancer risk assessment is recommended for women who have family members with BRCA-related cancers. BRCA-related cancers include breast, ovarian, tubal, and peritoneal cancers. Having family members with these cancers may be associated with an  increased risk for harmful changes (mutations) in the breast cancer genes BRCA1 and BRCA2. Results of the assessment will determine the need for genetic counseling and BRCA1 and BRCA2 testing.  Your health care provider may recommend that you be screened regularly for cancer of the pelvic organs (ovaries, uterus, and vagina). This screening involves a pelvic examination, including checking for microscopic changes to the surface of your cervix (Pap test). You may be encouraged to have this screening done every 3 years, beginning at age 73.  For women ages 21-65, health care providers may recommend pelvic exams and Pap testing every 3 years, or they may recommend the Pap and pelvic exam, combined with testing for human papilloma virus (HPV), every 5 years. Some types of HPV increase your risk of cervical cancer. Testing for HPV may also be done on women of any age with unclear Pap test results.  Other health care providers may not recommend any screening for nonpregnant women who are considered low risk for pelvic cancer and who do not have  symptoms. Ask your health care provider if a screening pelvic exam is right for you.  If you have had past treatment for cervical cancer or a condition that could lead to cancer, you need Pap tests and screening for cancer for at least 20 years after your treatment. If Pap tests have been discontinued, your risk factors (such as having a new sexual partner) need to be reassessed to determine if screening should resume. Some women have medical problems that increase the chance of getting cervical cancer. In these cases, your health care provider may recommend more frequent screening and Pap tests.  Colorectal cancer can be detected and often prevented. Most routine colorectal cancer screening begins at the age of 10 years and continues through age 44 years. However, your health care provider may recommend screening at an earlier age if you have risk factors for colon  cancer. On a yearly basis, your health care provider may provide home test kits to check for hidden blood in the stool. Use of a small camera at the end of a tube, to directly examine the colon (sigmoidoscopy or colonoscopy), can detect the earliest forms of colorectal cancer. Talk to your health care provider about this at age 77, when routine screening begins. Direct exam of the colon should be repeated every 5-10 years through age 57 years, unless early forms of precancerous polyps or small growths are found.  People who are at an increased risk for hepatitis B should be screened for this virus. You are considered at high risk for hepatitis B if:  You were born in a country where hepatitis B occurs often. Talk with your health care provider about which countries are considered high risk.  Your parents were born in a high-risk country and you have not received a shot to protect against hepatitis B (hepatitis B vaccine).  You have HIV or AIDS.  You use needles to inject street drugs.  You live with, or have sex with, someone who has hepatitis B.  You get hemodialysis treatment.  You take certain medicines for conditions like cancer, organ transplantation, and autoimmune conditions.  Hepatitis C blood testing is recommended for all people born from [birth year] through 1964 and any individual with known risks for hepatitis C.  Practice safe sex. Use condoms and avoid high-risk sexual practices to reduce the spread of sexually transmitted infections (STIs). STIs include gonorrhea, chlamydia, syphilis, trichomonas, herpes, HPV, and human immunodeficiency virus (HIV). Herpes, HIV, and HPV are viral illnesses that have no cure. They can result in disability, cancer, and death.  You should be screened for sexually transmitted illnesses (STIs) including gonorrhea and chlamydia if:  You are sexually active and are younger than 24 years.  You are older than 24 years and your health care provider tells you  that you are at risk for this type of infection.  Your sexual activity has changed since you were last screened and you are at an increased risk for chlamydia or gonorrhea. Ask your health care provider if you are at risk.  If you are at risk of being infected with HIV, it is recommended that you take a prescription medicine daily to prevent HIV infection. This is called preexposure prophylaxis (PrEP). You are considered at risk if:  You are sexually active and do not regularly use condoms or know the HIV status of your partner(s).  You take drugs by injection.  You are sexually active with a partner who has HIV.  Talk with your health care  provider about whether you are at high risk of being infected with HIV. If you choose to begin PrEP, you should first be tested for HIV. You should then be tested every 3 months for as long as you are taking PrEP.  Osteoporosis is a disease in which the bones lose minerals and strength with aging. This can result in serious bone fractures or breaks. The risk of osteoporosis can be identified using a bone density scan. Women ages 35 years and over and women at risk for fractures or osteoporosis should discuss screening with their health care providers. Ask your health care provider whether you should take a calcium supplement or vitamin D to reduce the rate of osteoporosis.  Menopause can be associated with physical symptoms and risks. Hormone replacement therapy is available to decrease symptoms and risks. You should talk to your health care provider about whether hormone replacement therapy is right for you.  Use sunscreen. Apply sunscreen liberally and repeatedly throughout the day. You should seek shade when your shadow is shorter than you. Protect yourself by wearing long sleeves, pants, a wide-brimmed hat, and sunglasses year round, whenever you are outdoors.  Once a month, do a whole body skin exam, using a mirror to look at the skin on your back. Tell  your health care provider of new moles, moles that have irregular borders, moles that are larger than a pencil eraser, or moles that have changed in shape or color.  Stay current with required vaccines (immunizations).  Influenza vaccine. All adults should be immunized every year.  Tetanus, diphtheria, and acellular pertussis (Td, Tdap) vaccine. Pregnant women should receive 1 dose of Tdap vaccine during each pregnancy. The dose should be obtained regardless of the length of time since the last dose. Immunization is preferred during the 27th-36th week of gestation. An adult who has not previously received Tdap or who does not know her vaccine status should receive 1 dose of Tdap. This initial dose should be followed by tetanus and diphtheria toxoids (Td) booster doses every 10 years. Adults with an unknown or incomplete history of completing a 3-dose immunization series with Td-containing vaccines should begin or complete a primary immunization series including a Tdap dose. Adults should receive a Td booster every 10 years.  Varicella vaccine. An adult without evidence of immunity to varicella should receive 2 doses or a second dose if she has previously received 1 dose. Pregnant females who do not have evidence of immunity should receive the first dose after pregnancy. This first dose should be obtained before leaving the health care facility. The second dose should be obtained 4-8 weeks after the first dose.  Human papillomavirus (HPV) vaccine. Females aged 13-26 years who have not received the vaccine previously should obtain the 3-dose series. The vaccine is not recommended for use in pregnant females. However, pregnancy testing is not needed before receiving a dose. If a female is found to be pregnant after receiving a dose, no treatment is needed. In that case, the remaining doses should be delayed until after the pregnancy. Immunization is recommended for any person with an immunocompromised  condition through the age of 26 years if she did not get any or all doses earlier. During the 3-dose series, the second dose should be obtained 4-8 weeks after the first dose. The third dose should be obtained 24 weeks after the first dose and 16 weeks after the second dose.  Zoster vaccine. One dose is recommended for adults aged 57 years or older  unless certain conditions are present.  Measles, mumps, and rubella (MMR) vaccine. Adults born before [birth year] generally are considered immune to measles and mumps. Adults born in [birth year] or later should have 1 or more doses of MMR vaccine unless there is a contraindication to the vaccine or there is laboratory evidence of immunity to each of the three diseases. A routine second dose of MMR vaccine should be obtained at least 28 days after the first dose for students attending postsecondary schools, health care workers, or international travelers. People who received inactivated measles vaccine or an unknown type of measles vaccine during 1963-1967 should receive 2 doses of MMR vaccine. People who received inactivated mumps vaccine or an unknown type of mumps vaccine before 1979 and are at high risk for mumps infection should consider immunization with 2 doses of MMR vaccine. For females of childbearing age, rubella immunity should be determined. If there is no evidence of immunity, females who are not pregnant should be vaccinated. If there is no evidence of immunity, females who are pregnant should delay immunization until after pregnancy. Unvaccinated health care workers born before [birth year] who lack laboratory evidence of measles, mumps, or rubella immunity or laboratory confirmation of disease should consider measles and mumps immunization with 2 doses of MMR vaccine or rubella immunization with 1 dose of MMR vaccine.  Pneumococcal 13-valent conjugate (PCV13) vaccine. When indicated, a person who is uncertain of his immunization history and has no record of immunization  should receive the PCV13 vaccine. All adults 22 years of age and older should receive this vaccine. An adult aged 87 years or older who has certain medical conditions and has not been previously immunized should receive 1 dose of PCV13 vaccine. This PCV13 should be followed with a dose of pneumococcal polysaccharide (PPSV23) vaccine. Adults who are at high risk for pneumococcal disease should obtain the PPSV23 vaccine at least 8 weeks after the dose of PCV13 vaccine. Adults older than 24 years of age who have normal immune system function should obtain the PPSV23 vaccine dose at least 1 year after the dose of PCV13 vaccine.  Pneumococcal polysaccharide (PPSV23) vaccine. When PCV13 is also indicated, PCV13 should be obtained first. All adults aged 31 years and older should be immunized. An adult younger than age 11 years who has certain medical conditions should be immunized. Any person who resides in a nursing home or long-term care facility should be immunized. An adult smoker should be immunized. People with an immunocompromised condition and certain other conditions should receive both PCV13 and PPSV23 vaccines. People with human immunodeficiency virus (HIV) infection should be immunized as soon as possible after diagnosis. Immunization during chemotherapy or radiation therapy should be avoided. Routine use of PPSV23 vaccine is not recommended for American Indians, [place] Natives, or people younger than 65 years unless there are medical conditions that require PPSV23 vaccine. When indicated, people who have unknown immunization and have no record of immunization should receive PPSV23 vaccine. One-time revaccination 5 years after the first dose of PPSV23 is recommended for people aged 19-64 years who have chronic kidney failure, nephrotic syndrome, asplenia, or immunocompromised conditions. People who received 1-2 doses of PPSV23 before age 38 years should receive another dose of PPSV23 vaccine at age 77 years  or later if at least 5 years have passed since the previous dose. Doses of PPSV23 are not needed for people immunized with PPSV23 at or after age 45 years.  Meningococcal vaccine. Adults with asplenia or persistent complement component deficiencies should  receive 2 doses of quadrivalent meningococcal conjugate (MenACWY-D) vaccine. The doses should be obtained at least 2 months apart. Microbiologists working with certain meningococcal bacteria, [place] recruits, people at risk during an outbreak, and people who travel to or live in countries with a high rate of meningitis should be immunized. A first-year college student up through age 15 years who is living in a residence hall should receive a dose if she did not receive a dose on or after her 16th birthday. Adults who have certain high-risk conditions should receive one or more doses of vaccine.  Hepatitis A vaccine. Adults who wish to be protected from this disease, have certain high-risk conditions, work with hepatitis A-infected animals, work in hepatitis A research labs, or travel to or work in countries with a high rate of hepatitis A should be immunized. Adults who were previously unvaccinated and who anticipate close contact with an international adoptee during the first 60 days after arrival in the [place] from a country with a high rate of hepatitis A should be immunized.  Hepatitis B vaccine. Adults who wish to be protected from this disease, have certain high-risk conditions, may be exposed to blood or other infectious body fluids, are household contacts or sex partners of hepatitis B positive people, are clients or workers in certain care facilities, or travel to or work in countries with a high rate of hepatitis B should be immunized.  Haemophilus influenzae type b (Hib) vaccine. A previously unvaccinated person with asplenia or sickle cell disease or having a scheduled splenectomy should receive 1 dose of Hib vaccine. Regardless of  previous immunization, a recipient of a hematopoietic stem cell transplant should receive a 3-dose series 6-12 months after her successful transplant. Hib vaccine is not recommended for adults with HIV infection. Preventive Services / Frequency Ages 57 to 52 years  Blood pressure check.** / Every 3-5 years.  Lipid and cholesterol check.** / Every 5 years beginning at age 12.  Clinical breast exam.** / Every 3 years for women in their 48s and 60s.  BRCA-related cancer risk assessment.** / For women who have family members with a BRCA-related cancer (breast, ovarian, tubal, or peritoneal cancers).  Pap test.** / Every 2 years from ages 37 through 24. Every 3 years starting at age 8 through age 48 or 14 with a history of 3 consecutive normal Pap tests.  HPV screening.** / Every 3 years from ages 76 through ages 74 to 34 with a history of 3 consecutive normal Pap tests.  Hepatitis C blood test.** / For any individual with known risks for hepatitis C.  Skin self-exam. / Monthly.  Influenza vaccine. / Every year.  Tetanus, diphtheria, and acellular pertussis (Tdap, Td) vaccine.** / Consult your health care provider. Pregnant women should receive 1 dose of Tdap vaccine during each pregnancy. 1 dose of Td every 10 years.  Varicella vaccine.** / Consult your health care provider. Pregnant females who do not have evidence of immunity should receive the first dose after pregnancy.  HPV vaccine. / 3 doses over 6 months, if 58 and younger. The vaccine is not recommended for use in pregnant females. However, pregnancy testing is not needed before receiving a dose.  Measles, mumps, rubella (MMR) vaccine.** / You need at least 1 dose of MMR if you were born in 1957 or later. You may also need a 2nd dose. For females of childbearing age, rubella immunity should be determined. If there is no evidence of immunity, females who are not  pregnant should be vaccinated. If there is no evidence of immunity,  females who are pregnant should delay immunization until after pregnancy.  Pneumococcal 13-valent conjugate (PCV13) vaccine.** / Consult your health care provider.  Pneumococcal polysaccharide (PPSV23) vaccine.** / 1 to 2 doses if you smoke cigarettes or if you have certain conditions.  Meningococcal vaccine.** / 1 dose if you are age 74 to 8 years and a Market researcher living in a residence hall, or have one of several medical conditions, you need to get vaccinated against meningococcal disease. You may also need additional booster doses.  Hepatitis A vaccine.** / Consult your health care provider.  Hepatitis B vaccine.** / Consult your health care provider.  Haemophilus influenzae type b (Hib) vaccine.** / Consult your health care provider. Ages 34 to 68 years  Blood pressure check.** / Every year.  Lipid and cholesterol check.** / Every 5 years beginning at age 56 years.  Lung cancer screening. / Every year if you are aged 88-80 years and have a 30-pack-year history of smoking and currently smoke or have quit within the past 15 years. Yearly screening is stopped once you have quit smoking for at least 15 years or develop a health problem that would prevent you from having lung cancer treatment.  Clinical breast exam.** / Every year after age 65 years.  BRCA-related cancer risk assessment.** / For women who have family members with a BRCA-related cancer (breast, ovarian, tubal, or peritoneal cancers).  Mammogram.** / Every year beginning at age 90 years and continuing for as long as you are in good health. Consult with your health care provider.  Pap test.** / Every 3 years starting at age 40 years through age 54 or 32 years with a history of 3 consecutive normal Pap tests.  HPV screening.** / Every 3 years from ages 77 years through ages 86 to 39 years with a history of 3 consecutive normal Pap tests.  Fecal occult blood test (FOBT) of stool. / Every year beginning at  age 41 years and continuing until age 31 years. You may not need to do this test if you get a colonoscopy every 10 years.  Flexible sigmoidoscopy or colonoscopy.** / Every 5 years for a flexible sigmoidoscopy or every 10 years for a colonoscopy beginning at age 38 years and continuing until age 32 years.  Hepatitis C blood test.** / For all people born from [birth year] through 1964 and any individual with known risks for hepatitis C.  Skin self-exam. / Monthly.  Influenza vaccine. / Every year.  Tetanus, diphtheria, and acellular pertussis (Tdap/Td) vaccine.** / Consult your health care provider. Pregnant women should receive 1 dose of Tdap vaccine during each pregnancy. 1 dose of Td every 10 years.  Varicella vaccine.** / Consult your health care provider. Pregnant females who do not have evidence of immunity should receive the first dose after pregnancy.  Zoster vaccine.** / 1 dose for adults aged 20 years or older.  Measles, mumps, rubella (MMR) vaccine.** / You need at least 1 dose of MMR if you were born in 1957 or later. You may also need a second dose. For females of childbearing age, rubella immunity should be determined. If there is no evidence of immunity, females who are not pregnant should be vaccinated. If there is no evidence of immunity, females who are pregnant should delay immunization until after pregnancy.  Pneumococcal 13-valent conjugate (PCV13) vaccine.** / Consult your health care provider.  Pneumococcal polysaccharide (PPSV23) vaccine.** / 1 to 2 doses  if you smoke cigarettes or if you have certain conditions.  Meningococcal vaccine.** / Consult your health care provider.  Hepatitis A vaccine.** / Consult your health care provider.  Hepatitis B vaccine.** / Consult your health care provider.  Haemophilus influenzae type b (Hib) vaccine.** / Consult your health care provider. Ages 58 years and over  Blood pressure check.** / Every year.  Lipid and cholesterol check.**  / Every 5 years beginning at age 20 years.  Lung cancer screening. / Every year if you are aged 69-80 years and have a 30-pack-year history of smoking and currently smoke or have quit within the past 15 years. Yearly screening is stopped once you have quit smoking for at least 15 years or develop a health problem that would prevent you from having lung cancer treatment.  Clinical breast exam.** / Every year after age 36 years.  BRCA-related cancer risk assessment.** / For women who have family members with a BRCA-related cancer (breast, ovarian, tubal, or peritoneal cancers).  Mammogram.** / Every year beginning at age 66 years and continuing for as long as you are in good health. Consult with your health care provider.  Pap test.** / Every 3 years starting at age 48 years through age 59 or 2 years with 3 consecutive normal Pap tests. Testing can be stopped between 65 and 70 years with 3 consecutive normal Pap tests and no abnormal Pap or HPV tests in the past 10 years.  HPV screening.** / Every 3 years from ages 68 years through ages 3 or 70 years with a history of 3 consecutive normal Pap tests. Testing can be stopped between 65 and 70 years with 3 consecutive normal Pap tests and no abnormal Pap or HPV tests in the past 10 years.  Fecal occult blood test (FOBT) of stool. / Every year beginning at age 30 years and continuing until age 67 years. You may not need to do this test if you get a colonoscopy every 10 years.  Flexible sigmoidoscopy or colonoscopy.** / Every 5 years for a flexible sigmoidoscopy or every 10 years for a colonoscopy beginning at age 11 years and continuing until age 73 years.  Hepatitis C blood test.** / For all people born from [birth year] through 1965 and any individual with known risks for hepatitis C.  Osteoporosis screening.** / A one-time screening for women ages 27 years and over and women at risk for fractures or osteoporosis.  Skin self-exam. / Monthly.  Influenza  vaccine. / Every year.  Tetanus, diphtheria, and acellular pertussis (Tdap/Td) vaccine.** / 1 dose of Td every 10 years.  Varicella vaccine.** / Consult your health care provider.  Zoster vaccine.** / 1 dose for adults aged 58 years or older.  Pneumococcal 13-valent conjugate (PCV13) vaccine.** / Consult your health care provider.  Pneumococcal polysaccharide (PPSV23) vaccine.** / 1 dose for all adults aged 34 years and older.  Meningococcal vaccine.** / Consult your health care provider.  Hepatitis A vaccine.** / Consult your health care provider.  Hepatitis B vaccine.** / Consult your health care provider.  Haemophilus influenzae type b (Hib) vaccine.** / Consult your health care provider. ** Family history and personal history of risk and conditions may change your health care provider's recommendations.   This information is not intended to replace advice given to you by your health care provider. Make sure you discuss any questions you have with your health care provider.   Document Released: 09/12/2001 Document Revised: 08/07/2014 Document Reviewed: 12/12/2010 Elsevier Interactive Patient Education 2016  [employer].

## 2015-12-28 DIAGNOSIS — M25561 Pain in right knee: Secondary | ICD-10-CM | POA: Diagnosis not present

## 2016-01-07 DIAGNOSIS — M25561 Pain in right knee: Secondary | ICD-10-CM | POA: Diagnosis not present

## 2016-01-13 DIAGNOSIS — M25561 Pain in right knee: Secondary | ICD-10-CM | POA: Diagnosis not present

## 2016-01-13 DIAGNOSIS — M2241 Chondromalacia patellae, right knee: Secondary | ICD-10-CM | POA: Diagnosis not present

## 2016-01-25 DIAGNOSIS — M2241 Chondromalacia patellae, right knee: Secondary | ICD-10-CM | POA: Diagnosis not present

## 2016-02-01 ENCOUNTER — Emergency Department (HOSPITAL_BASED_OUTPATIENT_CLINIC_OR_DEPARTMENT_OTHER)
Admission: EM | Admit: 2016-02-01 | Discharge: 2016-02-01 | Disposition: A | Discharge status: Home / Self Care | Payer: 59 | Attending: Emergency Medicine | Admitting: Emergency Medicine

## 2016-02-01 ENCOUNTER — Emergency Department (HOSPITAL_BASED_OUTPATIENT_CLINIC_OR_DEPARTMENT_OTHER): Payer: 59

## 2016-02-01 ENCOUNTER — Encounter (HOSPITAL_BASED_OUTPATIENT_CLINIC_OR_DEPARTMENT_OTHER): Payer: Self-pay | Admitting: Emergency Medicine

## 2016-02-01 DIAGNOSIS — T17290A Other foreign object in pharynx causing asphyxiation, initial encounter: Secondary | ICD-10-CM | POA: Insufficient documentation

## 2016-02-01 DIAGNOSIS — X58XXXA Exposure to other specified factors, initial encounter: Secondary | ICD-10-CM | POA: Insufficient documentation

## 2016-02-01 DIAGNOSIS — Y999 Unspecified external cause status: Secondary | ICD-10-CM | POA: Insufficient documentation

## 2016-02-01 DIAGNOSIS — Y939 Activity, unspecified: Secondary | ICD-10-CM | POA: Diagnosis not present

## 2016-02-01 DIAGNOSIS — R05 Cough: Secondary | ICD-10-CM | POA: Diagnosis not present

## 2016-02-01 DIAGNOSIS — J690 Pneumonitis due to inhalation of food and vomit: Secondary | ICD-10-CM | POA: Diagnosis not present

## 2016-02-01 DIAGNOSIS — Y929 Unspecified place or not applicable: Secondary | ICD-10-CM | POA: Diagnosis not present

## 2016-02-01 DIAGNOSIS — T17908A Unspecified foreign body in respiratory tract, part unspecified causing other injury, initial encounter: Secondary | ICD-10-CM

## 2016-02-01 MED ORDER — LIDOCAINE HCL (PF) 1 % IJ SOLN
INTRAMUSCULAR | Status: AC
  Administered 2016-02-01: 5 mL
  Filled 2016-02-01: qty 5

## 2016-02-01 MED ORDER — LIDOCAINE HCL (PF) 1 % IJ SOLN
5.0000 mL | Freq: Once | INTRAMUSCULAR | Status: AC
  Administered 2016-02-01: 5 mL

## 2016-02-01 MED ORDER — ALBUTEROL SULFATE (2.5 MG/3ML) 0.083% IN NEBU
INHALATION_SOLUTION | RESPIRATORY_TRACT | Status: AC
  Filled 2016-02-01: qty 6

## 2016-02-01 NOTE — Discharge Instructions (Signed)

## 2016-02-01 NOTE — ED Notes (Signed)
Per father patient swallowed large amount of pool water and has been anxious and has a barking cough.

## 2016-02-01 NOTE — ED Notes (Signed)
MD at bedside for pt f/u 

## 2016-02-01 NOTE — ED Provider Notes (Signed)
CSN: [id number]     Arrival date & time 02/01/16  1700 History  By signing my name below, I, Gwenlyn Fudge, attest that this documentation has been prepared under the direction and in the presence of Leo Grosser, MD. Electronically Signed: Gwenlyn Fudge, ED Scribe. 02/01/2016. 5:09 PM.  Chief Complaint  Patient presents with  . Aspiration    The history is provided by the patient and a parent. No language interpreter was used.    HPI Comments: Crystal [name] is a 25 y.o. female who presents to the Emergency Department with consistent barking cough onset PTA. Father states pt swallowed a large amount of salt pool water. Father states pt gagged a lot before before being taken inside to cool off. Father states pt was 'laryngospasming". Pt reports associated sensation of burning lungs.   Past Medical History  Diagnosis Date  . GERD (gastroesophageal reflux disease)   . UTI (lower urinary tract infection)   . Ovarian cyst   . Dyspepsia     functional  . Benign neoplasm of eyelid including canthus   . Yeast infection   . Herpes simplex type 1 antibody positive   . Irritable bowel syndrome   . Anal fissure 10/09/2011    Symptoms began in February 2013. Diagnosed by rectal and anoscopic examination 10/09/2011. Diltiazem gel begun.   . Internal hemorrhoids 10/09/2011   Past Surgical History  Procedure Laterality Date  . Myringotomy      x2  . Tympanostomy    . Tonsillectomy    . Nasal sinus surgery    . Laparoscopy  12/19/10  . Upper gastrointestinal endoscopy  10/17/2010    normal  . Tympanoplasty  2000   Family History  Problem Relation Age of Onset  . Diabetes Mother   . Hypertension Mother   . Anxiety disorder Mother   . Irritable bowel syndrome Father   . Depression Father   . Anxiety disorder Father   . Breast cancer Maternal Grandmother   . Heart disease Maternal Grandfather   . Colon cancer Neg Hx   . Brain cancer Paternal Grandmother     tumor   Social History   Substance Use Topics  . Smoking status: Never Smoker   . Smokeless tobacco: Never Used  . Alcohol Use: 0.0 oz/week    0 Standard drinks or equivalent per week     Comment: occasional   OB History    No data available     Review of Systems  Constitutional: Negative for diaphoresis.  Respiratory: Positive for cough.   All other systems reviewed and are negative.   Allergies  Cymbalta  Home Medications   Prior to Admission medications   Medication Sig Start Date End Date Taking? Authorizing Provider  citalopram (CELEXA) 20 MG tablet Take 1 tablet (20 mg total) by mouth daily. 12/03/15   Debbrah Alar, NP  desogestrel-ethinyl estradiol (KARIVA,AZURETTE,MIRCETTE) 0.15-0.02/0.01 MG (21/5) tablet Take 1 tablet by mouth daily.      Historical Provider, MD   LMP  (LMP Unknown) Physical Exam  Constitutional: She appears well-developed and well-nourished.  HENT:  Head: Normocephalic.  Eyes: Conjunctivae are normal.  Neck: Phonation normal.  Cardiovascular: Normal rate.   Pulmonary/Chest: Effort normal and breath sounds normal. No stridor. No respiratory distress. She has no wheezes. She has no rhonchi. She has no rales.  Abdominal: She exhibits no distension.  Musculoskeletal: Normal range of motion.  Neurological: She is alert.  Skin: Skin is warm and dry.  Psychiatric: Her  speech is normal and behavior is normal. Her mood appears anxious.  Nursing note and vitals reviewed.   ED Course  Procedures (including critical care time)  .DIAGNOSTIC STUDIES: Oxygen Saturation is 100% on RA, normal by my interpretation.    COORDINATION OF CARE: 5:10 PM Discussed treatment plan with pt at bedside which includes DG Chest and pt agreed to plan.  Labs Review Labs Reviewed - No data to display  Imaging Review Dg Chest 2 View  02/01/2016  CLINICAL DATA:  Aspirated salt water pool water today. Persistent cough since that time. EXAM: CHEST  2 VIEW COMPARISON:  Chest radiograph  09/03/2015 FINDINGS: The cardiomediastinal contours are normal. Trachea is midline. The lungs are symmetrically inflated and clear. Pulmonary vasculature is normal. No consolidation, pleural effusion, or pneumothorax. No acute osseous abnormalities are seen. IMPRESSION: Clear lungs.  No acute abnormality. Electronically Signed   By: Jeb Levering M.D.   On: 02/01/2016 18:19   I have personally reviewed and evaluated these images and lab results as part of my medical decision-making.   EKG Interpretation None      MDM   Final diagnoses:  Aspiration into airway, initial encounter    25 year old female presents after inhaling a breath of salt water pool earlier today and having ongoing coarse sounding cough with loud upper airway noises. On arrival she is quite anxious and coughing repetitively. Her airway appears intact in breath sounds are clear. No desaturations. No loss of consciousness with this event. She is treated with supportive care measures including nebulized lidocaine and cool mist, was monitored without change in her clinical status, was able to fall asleep and rest for a while. I discussed vocalization exercises with her to help with paroxysmal laryngeal motion that she appeared to be experiencing with her anxiety. Chest x-ray is without significant pulmonary edema or other concerning findings. Her symptoms have improved significantly over her emergency department stay and she has been cleared for discharge with supportive care measures and anti-inflammatories recommended at home.  I personally performed the services described in this documentation, which was scribed in my presence. The recorded information has been reviewed and is accurate.    Leo Grosser, MD 02/01/16 [phone]

## 2016-02-01 NOTE — ED Notes (Signed)
MD at bedside. 

## 2016-02-07 MED FILL — LEVONOR-ETH ESTRAD 0.1-0.02: 0.1-20 | 28 days supply | Qty: 28 | Fill #1

## 2016-03-06 MED FILL — CITALOPRAM HBR 20 MG TABLET: 20 | 30 days supply | Qty: 30 | Fill #1

## 2016-03-07 MED FILL — LEVONOR-ETH ESTRAD 0.1-0.02: 0.1-20 | 56 days supply | Qty: 56 | Fill #0

## 2016-03-14 DIAGNOSIS — Z6821 Body mass index (BMI) 21.0-21.9, adult: Secondary | ICD-10-CM | POA: Diagnosis not present

## 2016-03-14 DIAGNOSIS — Z01419 Encounter for gynecological examination (general) (routine) without abnormal findings: Secondary | ICD-10-CM | POA: Diagnosis not present

## 2016-04-07 ENCOUNTER — Telehealth: Payer: Self-pay | Admitting: *Deleted

## 2016-04-07 ENCOUNTER — Other Ambulatory Visit: Payer: Self-pay | Admitting: Internal Medicine

## 2016-04-07 MED ORDER — VALACYCLOVIR HCL 1 G PO TABS: 1000.0000 mg | ORAL_TABLET | Freq: Every day | ORAL | 0 refills | Status: DC

## 2016-04-07 MED FILL — valACYclovir HCL 1 GM TABS: 1 | 30 days supply | Qty: 30 | Fill #0

## 2016-04-07 NOTE — Telephone Encounter (Signed)
Pt came in to the office stating she has a cold sore outbreak and needs refill of Valtrex as soon as possible. Refill sent.

## 2016-04-24 MED FILL — LEVONOR-ETH ESTRAD 0.1-0.02: 0.1-20 | 84 days supply | Qty: 84 | Fill #0

## 2016-04-24 MED FILL — CITALOPRAM HBR 20 MG TABLET: 20 | 30 days supply | Qty: 30 | Fill #2

## 2016-06-14 MED FILL — CITALOPRAM HBR 20 MG TABLET: 20 | 30 days supply | Qty: 30 | Fill #3

## 2016-07-18 ENCOUNTER — Encounter: Payer: Self-pay | Admitting: Family

## 2016-07-18 ENCOUNTER — Ambulatory Visit (INDEPENDENT_AMBULATORY_CARE_PROVIDER_SITE_OTHER): Payer: 59 | Admitting: Family

## 2016-07-18 ENCOUNTER — Ambulatory Visit (HOSPITAL_BASED_OUTPATIENT_CLINIC_OR_DEPARTMENT_OTHER)
Admission: RE | Admit: 2016-07-18 | Discharge: 2016-07-18 | Disposition: A | Discharge status: Home / Self Care | Payer: 59 | Source: Ambulatory Visit | Attending: Family | Admitting: Family

## 2016-07-18 VITALS — BP 117/77 | HR 69 | Temp 98.0°F | Resp 16 | Ht 66.0 in | Wt 129.4 lb

## 2016-07-18 DIAGNOSIS — R1011 Right upper quadrant pain: Secondary | ICD-10-CM | POA: Diagnosis not present

## 2016-07-18 DIAGNOSIS — R1013 Epigastric pain: Secondary | ICD-10-CM

## 2016-07-18 LAB — CBC WITH DIFFERENTIAL/PLATELET
Basophils Absolute: 0 10*3/uL (ref 0.0–0.1)
Basophils Relative: 0.6 % (ref 0.0–3.0)
Eosinophils Absolute: 0.1 10*3/uL (ref 0.0–0.7)
Eosinophils Relative: 1.3 % (ref 0.0–5.0)
HCT: 41.1 % (ref 36.0–46.0)
Hemoglobin: 14.2 g/dL (ref 12.0–15.0)
Lymphocytes Relative: 38.7 % (ref 12.0–46.0)
Lymphs Abs: 2.9 10*3/uL (ref 0.7–4.0)
MCHC: 34.6 g/dL (ref 30.0–36.0)
MCV: 91.8 fl (ref 78.0–100.0)
Monocytes Absolute: 0.5 10*3/uL (ref 0.1–1.0)
Monocytes Relative: 6.8 % (ref 3.0–12.0)
Neutro Abs: 4 10*3/uL (ref 1.4–7.7)
Neutrophils Relative %: 52.6 % (ref 43.0–77.0)
Platelets: 197 10*3/uL (ref 150.0–400.0)
RBC: 4.47 Mil/uL (ref 3.87–5.11)
RDW: 12.1 % (ref 11.5–15.5)
WBC: 7.5 10*3/uL (ref 4.0–10.5)

## 2016-07-18 LAB — HEPATIC FUNCTION PANEL
ALT: 11 U/L (ref 0–35)
AST: 15 U/L (ref 0–37)
Albumin: 4 g/dL (ref 3.5–5.2)
Alkaline Phosphatase: 42 U/L (ref 39–117)
Bilirubin, Direct: 0.1 mg/dL (ref 0.0–0.3)
Total Bilirubin: 0.5 mg/dL (ref 0.2–1.2)
Total Protein: 6.6 g/dL (ref 6.0–8.3)

## 2016-07-18 LAB — LIPASE: Lipase: 55 U/L (ref 11.0–59.0)

## 2016-07-18 MED ORDER — PANTOPRAZOLE SODIUM 40 MG PO TBEC: 40.0000 mg | DELAYED_RELEASE_TABLET | Freq: Every day | ORAL | 3 refills | Status: AC

## 2016-07-18 MED ORDER — VALACYCLOVIR HCL 1 G PO TABS: 1000.0000 mg | ORAL_TABLET | Freq: Every day | ORAL | 0 refills | Status: DC

## 2016-07-18 MED FILL — PANTOPRAZOLE SOD DR 40 MG T: 40 | 90 days supply | Qty: 90 | Fill #0

## 2016-07-18 MED FILL — valACYclovir HCL 1 GM TABS: 1 | 30 days supply | Qty: 30 | Fill #0

## 2016-07-18 NOTE — Progress Notes (Signed)
Subjective:    Patient ID: Crystal [name], female    DOB: Aug 14, 1990, 25 y.o.   MRN: [id number]  HPI  Crystal [name] is a 25 yr old female who presents today to discuss worsening reflux.  Reports GERD symptoms have been worse x 1 month.  Also reports RUQ pain.  Using zantac and prilosec with some brief improvement. Pain is epigastric and radiates into her back.  Pain is "stabbing."  She reports associated nausea. Reports pain is worse after greasy food.    Review of Systems See HPI  Past Medical History:  Diagnosis Date  . Anal fissure 10/09/2011   Symptoms began in February 2013. Diagnosed by rectal and anoscopic examination 10/09/2011. Diltiazem gel begun.   . Benign neoplasm of eyelid including canthus   . Dyspepsia    functional  . GERD (gastroesophageal reflux disease)   . Herpes simplex type 1 antibody positive   . Internal hemorrhoids 10/09/2011  . Irritable bowel syndrome   . Ovarian cyst   . UTI (lower urinary tract infection)   . Yeast infection      Social History   Social History  . Marital status: Single    Spouse name: N/A  . Number of children: N/A  . Years of education: N/A   Occupational History  . Not on file.   Social History Main Topics  . Smoking status: Never Smoker  . Smokeless tobacco: Never Used  . Alcohol use 0.0 oz/week     Comment: occasional  . Drug use: No  . Sexual activity: Yes    Birth control/ protection: Pill   Other Topics Concern  . Not on file   Social History Narrative   Parents who are both [place] RN's   Works at [employer]   Works as a Chartered certified accountant at [employer]          Past Surgical History:  Procedure Laterality Date  . LAPAROSCOPY  12/19/10  . MYRINGOTOMY     x2  . NASAL SINUS SURGERY    . TONSILLECTOMY    . TYMPANOPLASTY  2000  . TYMPANOSTOMY    . UPPER GASTROINTESTINAL ENDOSCOPY  10/17/2010   normal    Family History  Problem Relation Age of Onset  . Diabetes Mother   . Hypertension Mother   .  Anxiety disorder Mother   . Irritable bowel syndrome Father   . Depression Father   . Anxiety disorder Father   . Breast cancer Maternal Grandmother   . Heart disease Maternal Grandfather   . Colon cancer Neg Hx   . Brain cancer Paternal Grandmother     tumor    Allergies  Allergen Reactions  . Cymbalta [Duloxetine Hcl] Nausea Only    Current Outpatient Prescriptions on File Prior to Visit  Medication Sig Dispense Refill  . citalopram (CELEXA) 20 MG tablet Take 1 tablet (20 mg total) by mouth daily. 30 tablet 5  . desogestrel-ethinyl estradiol (KARIVA,AZURETTE,MIRCETTE) 0.15-0.02/0.01 MG (21/5) tablet Take 1 tablet by mouth daily.      . valACYclovir (VALTREX) 1000 MG tablet Take 1 tablet (1,000 mg total) by mouth daily. 30 tablet 0   No current facility-administered medications on file prior to visit.     BP 117/77 (BP Location: Right Arm, Cuff Size: Normal)   Pulse 69   Temp 98 F (36.7 C) (Oral)   Resp 16   Ht 5\' 6"  (1.676 m)   Wt 129 lb 6.4 oz (58.7 kg)  LMP 06/30/2016   SpO2 100% Comment: room air  BMI 20.89 kg/m       Objective:   Physical Exam  Constitutional: She appears well-developed and well-nourished.  Cardiovascular: Normal rate, regular rhythm and normal heart sounds.   No murmur heard. Pulmonary/Chest: Effort normal and breath sounds normal. No respiratory distress. She has no wheezes.  Abdominal: Soft. Bowel sounds are normal. There is tenderness in the epigastric area. There is no guarding and negative Murphy's sign.  Psychiatric: She has a normal mood and affect. Her behavior is normal. Judgment and thought content normal.          Assessment & Plan:  Epigastric pain- will obtain routine labs, abd [place] to evaluate for cholecystitis. Begin protonix. If work up unrevealing and no improvement with PPI, plan referral back to GI.

## 2016-07-18 NOTE — Progress Notes (Signed)
Pre visit review using our clinic review tool, if applicable. No additional management support is needed unless otherwise documented below in the visit note. 

## 2016-07-18 NOTE — Patient Instructions (Signed)
Begin protonix. Complete lab work prior to leaving. Schedule ultrasound on the first floor.

## 2016-07-19 ENCOUNTER — Encounter: Payer: Self-pay | Admitting: Family

## 2016-07-27 MED FILL — LEVONOR-ETH ESTRAD 0.1-0.02: 0.1-20 | 84 days supply | Qty: 84 | Fill #1

## 2016-08-01 MED FILL — CITALOPRAM HBR 20 MG TABLET: 20 | 30 days supply | Qty: 30 | Fill #4

## 2016-08-18 ENCOUNTER — Ambulatory Visit: Payer: 59 | Admitting: Family

## 2016-08-26 ENCOUNTER — Encounter: Payer: Self-pay | Admitting: Family

## 2016-09-28 MED FILL — CITALOPRAM HBR 20 MG TABLET: 20 | 30 days supply | Qty: 30 | Fill #5

## 2016-10-16 ENCOUNTER — Emergency Department (HOSPITAL_BASED_OUTPATIENT_CLINIC_OR_DEPARTMENT_OTHER)
Admission: EM | Admit: 2016-10-16 | Discharge: 2016-10-16 | Disposition: A | Discharge status: Home / Self Care | Payer: 59 | Attending: Emergency Medicine | Admitting: Emergency Medicine

## 2016-10-16 ENCOUNTER — Encounter (HOSPITAL_BASED_OUTPATIENT_CLINIC_OR_DEPARTMENT_OTHER): Payer: Self-pay | Admitting: Emergency Medicine

## 2016-10-16 DIAGNOSIS — R112 Nausea with vomiting, unspecified: Secondary | ICD-10-CM | POA: Diagnosis not present

## 2016-10-16 DIAGNOSIS — R1084 Generalized abdominal pain: Secondary | ICD-10-CM | POA: Diagnosis not present

## 2016-10-16 DIAGNOSIS — R197 Diarrhea, unspecified: Secondary | ICD-10-CM | POA: Diagnosis not present

## 2016-10-16 LAB — COMPREHENSIVE METABOLIC PANEL
ALT: 29 U/L (ref 14–54)
AST: 36 U/L (ref 15–41)
Albumin: 4.4 g/dL (ref 3.5–5.0)
Alkaline Phosphatase: 50 U/L (ref 38–126)
Anion gap: 9 (ref 5–15)
BUN: 13 mg/dL (ref 6–20)
CO2: 23 mmol/L (ref 22–32)
Calcium: 8.9 mg/dL (ref 8.9–10.3)
Chloride: 106 mmol/L (ref 101–111)
Creatinine, Ser: 0.88 mg/dL (ref 0.44–1.00)
GFR calc Af Amer: >60 mL/min (ref >60)
GFR calc non Af Amer: >60 mL/min (ref >60)
Glucose, Bld: 102 mg/dL — ABNORMAL HIGH (ref 65–99)
Potassium: 3.6 mmol/L (ref 3.5–5.1)
Sodium: 138 mmol/L (ref 135–145)
Total Bilirubin: 0.9 mg/dL (ref 0.3–1.2)
Total Protein: 7.4 g/dL (ref 6.5–8.1)

## 2016-10-16 LAB — PREGNANCY, URINE: Preg Test, Ur: NEGATIVE

## 2016-10-16 LAB — CBC
HCT: 43.5 % (ref 36.0–46.0)
Hemoglobin: 15.3 g/dL — ABNORMAL HIGH (ref 12.0–15.0)
MCH: 32.2 pg (ref 26.0–34.0)
MCHC: 35.2 g/dL (ref 30.0–36.0)
MCV: 91.6 fL (ref 78.0–100.0)
Platelets: 185 10*3/uL (ref 150–400)
RBC: 4.75 MIL/uL (ref 3.87–5.11)
RDW: 12.5 % (ref 11.5–15.5)
WBC: 8.5 10*3/uL (ref 4.0–10.5)

## 2016-10-16 LAB — C DIFFICILE QUICK SCREEN W PCR REFLEX
C Diff antigen: NEGATIVE
C Diff interpretation: NOT DETECTED
C Diff toxin: NEGATIVE

## 2016-10-16 LAB — URINALYSIS, ROUTINE W REFLEX MICROSCOPIC
Bilirubin Urine: NEGATIVE
Glucose, UA: NEGATIVE mg/dL
Hgb urine dipstick: NEGATIVE
Ketones, ur: NEGATIVE mg/dL
Leukocytes, UA: NEGATIVE
Nitrite: NEGATIVE
Protein, ur: NEGATIVE mg/dL
Specific Gravity, Urine: 1.006 (ref 1.005–1.030)
pH: 6 (ref 5.0–8.0)

## 2016-10-16 LAB — LIPASE, BLOOD: Lipase: 47 U/L (ref 11–51)

## 2016-10-16 MED ORDER — DICYCLOMINE HCL 10 MG PO CAPS: 10.0000 mg | ORAL_CAPSULE | Freq: Three times a day (TID) | ORAL | 0 refills | Status: DC | PRN

## 2016-10-16 MED ORDER — ONDANSETRON HCL 4 MG/2ML IJ SOLN
4.0000 mg | Freq: Once | INTRAMUSCULAR | Status: AC | PRN
  Administered 2016-10-16: 4 mg via INTRAVENOUS
  Filled 2016-10-16: qty 2

## 2016-10-16 MED ORDER — MORPHINE SULFATE (PF) 4 MG/ML IV SOLN
4.0000 mg | Freq: Once | INTRAVENOUS | Status: AC
  Administered 2016-10-16: 4 mg via INTRAVENOUS
  Filled 2016-10-16: qty 1

## 2016-10-16 MED ORDER — DICYCLOMINE HCL 10 MG PO CAPS
10.0000 mg | ORAL_CAPSULE | Freq: Once | ORAL | Status: AC
  Administered 2016-10-16: 10 mg via ORAL
  Filled 2016-10-16: qty 1

## 2016-10-16 MED ORDER — LACTATED RINGERS IV BOLUS (SEPSIS)
1000.0000 mL | Freq: Once | INTRAVENOUS | Status: AC
  Administered 2016-10-16: 1000 mL via INTRAVENOUS

## 2016-10-16 MED ORDER — ONDANSETRON 4 MG PO TBDP
4.0000 mg | ORAL_TABLET | Freq: Four times a day (QID) | ORAL | 0 refills | Status: DC | PRN
Start: 2016-10-16 — End: 2019-02-11

## 2016-10-16 MED FILL — LEVONOR-ETH ESTRAD 0.1-0.02: 0.1-20 | 84 days supply | Qty: 84 | Fill #2

## 2016-10-16 NOTE — ED Provider Notes (Signed)
[facility] DEPT Provider Note   CSN: [id number] Arrival date & time: 10/16/16  1652  By signing my name below, I, Evelene Croon, attest that this documentation has been prepared under the direction and in the presence of Quintella Reichert, MD . Electronically Signed: Evelene Croon, Scribe. 10/16/2016. 5:34 PM.  History   Chief Complaint Chief Complaint  Patient presents with  . Diarrhea  . Abdominal Pain    The history is provided by the patient. No language interpreter was used.    HPI Comments:  Crystal [name] is a 26 y.o. female with a history of IBS, who presents to the Emergency Department complaining of moderate-severe, upper abdominal pain since ~0000. She reports multiple episodes of associated diarrhea since ~ 0400 this am and nausea. No vomiting or dysuria. No recent use of antibiotics. 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 No alleviating factors noted. No recent travel or bad food exposure.   Past Medical History:  Diagnosis Date  . Anal fissure 10/09/2011   Symptoms began in February 2013. Diagnosed by rectal and anoscopic examination 10/09/2011. Diltiazem gel begun.   . Benign neoplasm of eyelid including canthus   . Dyspepsia    functional  . GERD (gastroesophageal reflux disease)   . Herpes simplex type 1 antibody positive   . Internal hemorrhoids 10/09/2011  . Irritable bowel syndrome   . Ovarian cyst   . UTI (lower urinary tract infection)   . Yeast infection     Patient Active Problem List   Diagnosis Date Noted  . Right knee pain 11/19/2015  . Bleeding internal hemorrhoids 11/27/2014  . ADD (attention deficit disorder) 10/01/2014  . Generalized abdominal pain 07/09/2014  . Axillary lymphadenopathy 05/18/2014  . Anxiety and depression 04/14/2014  . Fatigue 04/14/2014  . Routine general medical examination at a health care facility 03/25/2013  . IBS (irritable bowel syndrome)  02/25/2013  . BENIGN NEOPLASM OF EYELID INCLUDING  CANTHUS 08/29/2010  . Functional dyspepsia 08/29/2010    Past Surgical History:  Procedure Laterality Date  . LAPAROSCOPY  12/19/10  . MYRINGOTOMY     x2  . NASAL SINUS SURGERY    . TONSILLECTOMY    . TYMPANOPLASTY  2000  . TYMPANOSTOMY    . UPPER GASTROINTESTINAL ENDOSCOPY  10/17/2010   normal    OB History    No data available       Home Medications    Prior to Admission medications   Medication Sig Start Date End Date Taking? Authorizing Provider  citalopram (CELEXA) 20 MG tablet Take 1 tablet (20 mg total) by mouth daily. 12/03/15   Debbrah Alar, NP  desogestrel-ethinyl estradiol (KARIVA,AZURETTE,MIRCETTE) 0.15-0.02/0.01 MG (21/5) tablet Take 1 tablet by mouth daily.      Historical Provider, MD  dicyclomine (BENTYL) 10 MG capsule Take 1 capsule (10 mg total) by mouth 3 (three) times daily as needed for spasms. 10/16/16   Quintella Reichert, MD  ondansetron (ZOFRAN ODT) 4 MG disintegrating tablet Take 1 tablet (4 mg total) by mouth every 6 (six) hours as needed for nausea or vomiting. 10/16/16   Quintella Reichert, MD  pantoprazole (PROTONIX) 40 MG tablet Take 1 tablet (40 mg total) by mouth daily. 07/18/16   Debbrah Alar, NP  valACYclovir (VALTREX) 1000 MG tablet Take 1 tablet (1,000 mg total) by mouth daily. 07/18/16   Debbrah Alar, NP    Family History Family History  Problem Relation Age of Onset  . Diabetes Mother   . Hypertension  Mother   . Anxiety disorder Mother   . Irritable bowel syndrome Father   . Depression Father   . Anxiety disorder Father   . Breast cancer Maternal Grandmother   . Heart disease Maternal Grandfather   . Brain cancer Paternal Grandmother     tumor  . Colon cancer Neg Hx     Social History Social History  Substance Use Topics  . Smoking status: Never Smoker  . Smokeless tobacco: Never Used  . Alcohol use 0.0 oz/week     Comment: occasional     Allergies   Cymbalta [duloxetine hcl]   Review of Systems Review of  Systems  10 systems reviewed and all are negative for acute change except as noted in the HPI.   Physical Exam Updated Vital Signs BP 105/60   Pulse 100   Temp (!) 100.7 F (38.2 C) (Oral)   Resp 18   Ht 5\' 6"  (1.676 m)   Wt 127 lb (57.6 kg)   LMP 09/17/2016   SpO2 97%   BMI 20.50 kg/m   Physical Exam  Constitutional: She is oriented to person, place, and time. She appears well-developed and well-nourished.  Uncomfortable appearing  HENT:  Head: Normocephalic and atraumatic.  Cardiovascular: Normal rate and regular rhythm.   No murmur heard. Pulmonary/Chest: Effort normal and breath sounds normal. No respiratory distress.  Abdominal: Soft. There is tenderness (moderate diffuse tenderness). There is no rebound and no guarding.  Musculoskeletal: She exhibits no edema or tenderness.  Neurological: She is alert and oriented to person, place, and time.  Skin: Skin is warm and dry.  Psychiatric: She has a normal mood and affect. Her behavior is normal.  Nursing note and vitals reviewed.    ED Treatments / Results  DIAGNOSTIC STUDIES:  Oxygen Saturation is 100% on RA, normal by my interpretation.    COORDINATION OF CARE:  5:32 PM Discussed treatment plan with pt at bedside and pt agreed to plan.  Labs (all labs ordered are listed, but only abnormal results are displayed) Labs Reviewed  GASTROINTESTINAL PANEL BY PCR, STOOL (REPLACES STOOL CULTURE) - Abnormal; Notable for the following:       Result Value   Rotavirus A DETECTED (*)    All other components within normal limits  COMPREHENSIVE METABOLIC PANEL - Abnormal; Notable for the following:    Glucose, Bld 102 (*)    All other components within normal limits  CBC - Abnormal; Notable for the following:    Hemoglobin 15.3 (*)    All other components within normal limits  C DIFFICILE QUICK SCREEN W PCR REFLEX  LIPASE, BLOOD  URINALYSIS, ROUTINE W REFLEX MICROSCOPIC  PREGNANCY, URINE    EKG  EKG  Interpretation None       Radiology No results found.  Procedures Procedures (including critical care time)  Medications Ordered in ED Medications  ondansetron (ZOFRAN) injection 4 mg (4 mg Intravenous Given 10/16/16 1722)  morphine 4 MG/ML injection 4 mg (4 mg Intravenous Given 10/16/16 1749)  lactated ringers bolus 1,000 mL (0 mLs Intravenous Stopped 10/16/16 1943)  dicyclomine (BENTYL) capsule 10 mg (10 mg Oral Given 10/16/16 1957)     Initial Impression / Assessment and Plan / ED Course  I have reviewed the triage vital signs and the nursing notes.  Pertinent labs & imaging results that were available during my care of the patient were reviewed by me and considered in my medical decision making (see chart for details).  Clinical Course as of  Oct 19 923  Mon Oct 16, 2016  1945 Repeat assessment.  Pt reports persistent but decreased abdominal cramping.  She has ongoing diarrhea.  Appears improved. Mild diffuse abdominal tenderness.    [ER]    Clinical Course User Index [ER] Quintella Reichert, MD    Patient here for nausea, vomiting, abdominal pain, diarrhea. She has significant diarrhea in the emergency department. On repeat assessment her abdominal pain is decreased. She has no peritoneal findings on examination. Presentation is consistent with a viral gastroenteritis. Stool studies are pending. Counseled patient on outpatient follow-up as well as enteric precautions. Discussed importance of repeat abdominal examination if she does have ongoing abdominal pain. Current clinical picture is not consistent with appendicitis, pancreatitis, serious bacterial infection.  Final Clinical Impressions(s) / ED Diagnoses   Final diagnoses:  Nausea vomiting and diarrhea  Generalized abdominal pain    New Prescriptions Discharge Medication List as of 10/16/2016  8:33 PM    START taking these medications   Details  dicyclomine (BENTYL) 10 MG capsule Take 1 capsule (10 mg total) by mouth 3  (three) times daily as needed for spasms., Starting Mon 10/16/2016, Print    ondansetron (ZOFRAN ODT) 4 MG disintegrating tablet Take 1 tablet (4 mg total) by mouth every 6 (six) hours as needed for nausea or vomiting., Starting Mon 10/16/2016, Print       I personally performed the services described in this documentation, which was scribed in my presence. The recorded information has been reviewed and is accurate.    Quintella Reichert, MD 10/18/16 [phone]

## 2016-10-16 NOTE — ED Triage Notes (Signed)
Pt reports feeling ill at work last night. Reports 20+ episodes diarrhea since 0400. +nausea

## 2016-10-16 NOTE — ED Notes (Signed)
Patient with continued nausea and diarrhea

## 2016-10-17 ENCOUNTER — Telehealth: Payer: Self-pay | Admitting: Family

## 2016-10-17 LAB — GASTROINTESTINAL PANEL BY PCR, STOOL (REPLACES STOOL CULTURE)

## 2016-10-17 MED FILL — ONDANSETRON ODT 4 MG TABLET: 4 | 2 days supply | Qty: 10 | Fill #0

## 2016-10-17 MED FILL — DICYCLOMINE 10 MG CAPSULE: 10 | 5 days supply | Qty: 15 | Fill #0

## 2016-10-17 NOTE — Telephone Encounter (Signed)
Caller name:[facility] Relationship to patient: Can be reached: Pharmacy:  Reason for call:FYI patient tested positive for Rotavirus

## 2016-10-17 NOTE — Telephone Encounter (Signed)
Please contact patient and let her know that she tested positive for rotavirus in the ED.  This should resolve on its own. Ensure adequate hydration.  How is she feeling?

## 2016-10-17 NOTE — Telephone Encounter (Signed)
Pt was seen in ER on 10/16/16 and diagnosed.  Please advise?

## 2016-10-18 NOTE — Telephone Encounter (Signed)
Tried to reach pt left message on voicemail to call back.

## 2016-10-19 ENCOUNTER — Encounter: Payer: Self-pay | Admitting: Medical

## 2016-10-19 ENCOUNTER — Ambulatory Visit (INDEPENDENT_AMBULATORY_CARE_PROVIDER_SITE_OTHER): Payer: 59 | Admitting: Medical

## 2016-10-19 VITALS — BP 113/78 | HR 71 | Temp 97.6°F | Ht 66.0 in | Wt 123.0 lb

## 2016-10-19 DIAGNOSIS — A080 Rotaviral enteritis: Secondary | ICD-10-CM | POA: Diagnosis not present

## 2016-10-19 MED ORDER — VALACYCLOVIR HCL 1 G PO TABS: 1000.0000 mg | ORAL_TABLET | Freq: Every day | ORAL | 0 refills | Status: DC

## 2016-10-19 MED FILL — valACYclovir HCL 1 GM TABS: 1 | 30 days supply | Qty: 30 | Fill #0

## 2016-10-19 NOTE — Progress Notes (Signed)
Pre visit review using our clinic review tool, if applicable. No additional management support is needed unless otherwise documented below in the visit note. 

## 2016-10-19 NOTE — Progress Notes (Signed)
Subjective:    Patient ID: Crystal [name], female    DOB: October 16, 1990, 26 y.o.   MRN: [id number]  HPI  Pt in for a follow up. Pt states recent diagnose with rotavirus. Last loose/stool was Tuesday night and started on Monday am.. Pt states no loose stools presently. Pt states she is hydrating well. She states if she overhydrates will feel nausea. 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  Pt started to get loose stools on Monday morning. Had watery loose stools at onset and stomach cramping. Pt rehydrated day of ED evaluation by iv.  LMP- on presently.  Pt urine pregnancy was negative. k was borderline. Sugar was mild high.  Pt scheduled to work tomorrow next 3 days.  Pt still feels tired still but some improved.  Pt has bentyl and zofran. She wants to get back to work but after discussion agreed to another day off to get back her energy.       Review of Systems  Constitutional: Positive for fatigue. Negative for chills and fever.       Still some fatigued but improved since onset of illness.  Respiratory: Negative for cough, choking, chest tightness, shortness of breath and wheezing.   Cardiovascular: Negative for chest pain and palpitations.  Gastrointestinal: Positive for nausea. Negative for abdominal pain, blood in stool, rectal pain and vomiting.       Now only if eats or drinks a lot.  Genitourinary: Negative for dysuria, flank pain and frequency.  Musculoskeletal: Negative for back pain, joint swelling and neck stiffness.  Skin: Negative for rash.  Neurological: Negative for dizziness, speech difficulty, weakness, numbness and headaches.  Hematological: Negative for adenopathy. Does not bruise/bleed easily.  Psychiatric/Behavioral: Negative for behavioral problems and confusion.   Past Medical History:  Diagnosis Date  . Anal fissure 10/09/2011   Symptoms began in February 2013. Diagnosed by rectal and anoscopic examination 10/09/2011. Diltiazem gel begun.   .  Benign neoplasm of eyelid including canthus   . Dyspepsia    functional  . GERD (gastroesophageal reflux disease)   . Herpes simplex type 1 antibody positive   . Internal hemorrhoids 10/09/2011  . Irritable bowel syndrome   . Ovarian cyst   . UTI (lower urinary tract infection)   . Yeast infection      Social History   Social History  . Marital status: Single    Spouse name: N/A  . Number of children: N/A  . Years of education: N/A   Occupational History  . Not on file.   Social History Main Topics  . Smoking status: Never Smoker  . Smokeless tobacco: Never Used  . Alcohol use 0.0 oz/week     Comment: occasional  . Drug use: No  . Sexual activity: Yes    Birth control/ protection: Pill   Other Topics Concern  . Not on file   Social History Narrative   Parents who are both [place] RN's   Works at [employer]   Works as a Chartered certified accountant at [employer]          Past Surgical History:  Procedure Laterality Date  . LAPAROSCOPY  12/19/10  . MYRINGOTOMY     x2  . NASAL SINUS SURGERY    . TONSILLECTOMY    . TYMPANOPLASTY  2000  . TYMPANOSTOMY    . UPPER GASTROINTESTINAL ENDOSCOPY  10/17/2010   normal    Family History  Problem Relation Age of Onset  .  Diabetes Mother   . Hypertension Mother   . Anxiety disorder Mother   . Irritable bowel syndrome Father   . Depression Father   . Anxiety disorder Father   . Breast cancer Maternal Grandmother   . Heart disease Maternal Grandfather   . Brain cancer Paternal Grandmother     tumor  . Colon cancer Neg Hx     Allergies  Allergen Reactions  . Cymbalta [Duloxetine Hcl] Nausea Only    Current Outpatient Prescriptions on File Prior to Visit  Medication Sig Dispense Refill  . citalopram (CELEXA) 20 MG tablet Take 1 tablet (20 mg total) by mouth daily. 30 tablet 5  . desogestrel-ethinyl estradiol (KARIVA,AZURETTE,MIRCETTE) 0.15-0.02/0.01 MG (21/5) tablet Take 1 tablet by mouth daily.      Marland Kitchen dicyclomine (BENTYL)  10 MG capsule Take 1 capsule (10 mg total) by mouth 3 (three) times daily as needed for spasms. 15 capsule 0  . ondansetron (ZOFRAN ODT) 4 MG disintegrating tablet Take 1 tablet (4 mg total) by mouth every 6 (six) hours as needed for nausea or vomiting. 10 tablet 0  . pantoprazole (PROTONIX) 40 MG tablet Take 1 tablet (40 mg total) by mouth daily. 30 tablet 3   No current facility-administered medications on file prior to visit.     BP 113/78   Pulse 71   Temp 97.6 F (36.4 C) (Oral)   Ht 5\' 6"  (1.676 m)   Wt 123 lb (55.8 kg)   LMP 10/17/2016   SpO2 100%   BMI 19.85 kg/m       Objective:   Physical Exam  General- No acute distress. Pleasant patient. Neck- Full range of motion, no jvd Lungs- Clear, even and unlabored. Heart- regular rate and rhythm. Neurologic- CNII- XII grossly intact.  Abdomen- soft, nt, nd, +bs, no rebound or guarding and no organomegaly.      Assessment & Plan:   For your recent rotavirus infection. I recommend you rest, hydrate and eat bland foods. Can continue your bentyl and zofran. Return to work on Friday. Provided no recurrence of diarrhea. If recurrent diarrhea let me know and can extend work note until Monday. Also for safety sake double glove on return to work.  Follow up as regularly scheduled with pcp or as needed

## 2016-10-19 NOTE — Patient Instructions (Addendum)
For your recent rotavirus infection. I recommend you rest, hydrate and eat bland foods. Can continue your bentyl and zofran. Return to work on Friday. Provided no recurrence of diarrhea. If recurrent diarrhea let me know and can extend work note until Monday. Also for safety sake double glove on return to work.  Follow up as regularly scheduled with pcp or as needed     Rotavirus Infection, Adult Rotavirus infection may also be called the stomach flu. This condition is caused by a virus. This virus can be passed from person to person very easily (is very contagious). This condition may affect your stomach, small intestine, and large intestine. It can cause sudden watery diarrhea, fever, and vomiting. Diarrhea and vomiting can make you feel weak and cause you to become dehydrated. You may not be able to keep fluids down. Dehydration can make you tired and thirsty, cause you to have a dry mouth, and decrease how often you urinate. Older adults and people with other diseases or a weak defense system (immune system) are at higher risk for dehydration. It is important to replace the fluids that you lose from diarrhea and vomiting. If you become severely dehydrated, you may need to get fluids through an IV tube. What are the causes? This condition is caused by rotavirus. You can get sick by eating food, drinking water, or touching a surface contaminated with this virus. You can also catch a virus by sharing utensils or other personal items with an infected person. What increases the risk? This condition is more likely to develop in people:  Who have a weak immune system.  Who live with one or more children who are younger than 45 years old.  Who live in a nursing home. What are the signs or symptoms? Symptoms of this condition may occur 1-4 days after you become infected. The most common symptoms are watery diarrhea and vomiting. You may also have fever. Other symptoms may include:  Pain in the  abdomen.  Chills.  Weakness.  Nausea.  Loss of appetite. How is this diagnosed? This condition is diagnosed with a medical history and physical exam. You may also have a stool test to check for the virus. How is this treated? This condition typically goes away on its own. The focus of treatment is to restore lost fluids (rehydration). Your health care provider may recommend that you take an oral rehydration solution (ORS) to replace important salts and minerals (electrolytes) in your body. Severe cases of this condition may require giving fluids through an IV tube. Treatment may also include medicine to help with your symptoms. Follow these instructions at home: Follow instructions from your health care provider about how to care for yourself at home. Eating and drinking  Follow these recommendations as told by your health care provider:  Take an ORS. This is a drink that is sold at pharmacies and retail stores.  Drink clear fluids in small amounts as you are able. Clear fluids include water, ice chips, diluted fruit juice, and low-calorie sports drinks.  Eat bland, easy-to-digest foods in small amounts as you are able. These foods include bananas, applesauce, rice, lean meats, toast, and crackers.  Avoid fluids that contain a lot of sugar or caffeine, such as energy drinks, sports drinks, and soda.  Avoid alcohol.  Avoid spicy or fatty foods. General instructions    Drink enough fluid to keep your urine clear or pale yellow.  Wash your hands often. If soap and water are not available,  use hand sanitizer.  Make sure that all people in your household wash their hands well and often.  Take over-the-counter and prescription medicines only as told by your health care provider.  Rest at home while you recover.  Watch your condition for any changes.  Take a warm bath to relieve any burning or pain from frequent diarrhea episodes.  Keep all follow-up visits as told by your  health care provider. This is important. Contact a health care provider if:  You cannot keep fluids down.  Your symptoms get worse.  You have new symptoms.  You feel light-headed or dizzy.  You have muscle cramps. Get help right away if:  You have chest pain.  You feel extremely weak or you faint.  You see blood in your vomit.  Your vomit looks like coffee grounds.  You have stools that are bloody or black, or stools that look like tar.  You have a severe headache, a stiff neck, or both.  You have a rash.  You have severe pain, cramping, or bloating in your abdomen.  You have trouble breathing or you are breathing very quickly.  Your heart is beating very quickly.  Your skin feels cold and clammy.  You feel confused.  You have pain when you urinate.  You have signs of dehydration, such as:  Dark urine, very little urine, or no urine.  Cracked lips.  Dry mouth.  Sunken eyes.  Sleepiness.  Weakness. This information is not intended to replace advice given to you by your health care provider. Make sure you discuss any questions you have with your health care provider. Document Released: 07/17/2005 Document Revised: 12/21/2015 Document Reviewed: 03/23/2015 Elsevier Interactive Patient Education  2017 [employer].

## 2016-10-20 NOTE — Telephone Encounter (Signed)
Attempted to reach pt and left message to check mychart. Message sent. 

## 2016-11-14 ENCOUNTER — Other Ambulatory Visit: Payer: Self-pay | Admitting: Family

## 2016-11-14 MED FILL — CITALOPRAM HBR 20 MG TABLET: 20 | 30 days supply | Qty: 30 | Fill #0

## 2017-01-08 MED FILL — LEVONOR-ETH ESTRAD 0.1-0.02: 0.1-20 | 84 days supply | Qty: 84 | Fill #3

## 2017-01-08 MED FILL — TRIAMCINOLONE 0.1% PASTE: 0.1 | 7 days supply | Qty: 5 | Fill #0

## 2017-03-05 ENCOUNTER — Emergency Department (HOSPITAL_BASED_OUTPATIENT_CLINIC_OR_DEPARTMENT_OTHER): Payer: 59

## 2017-03-05 ENCOUNTER — Emergency Department (HOSPITAL_BASED_OUTPATIENT_CLINIC_OR_DEPARTMENT_OTHER)
Admission: EM | Admit: 2017-03-05 | Discharge: 2017-03-05 | Disposition: A | Discharge status: Home / Self Care | Payer: 59 | Attending: Emergency Medicine | Admitting: Emergency Medicine

## 2017-03-05 ENCOUNTER — Encounter (HOSPITAL_BASED_OUTPATIENT_CLINIC_OR_DEPARTMENT_OTHER): Payer: Self-pay | Admitting: *Deleted

## 2017-03-05 DIAGNOSIS — M25522 Pain in left elbow: Secondary | ICD-10-CM | POA: Insufficient documentation

## 2017-03-05 DIAGNOSIS — S59902A Unspecified injury of left elbow, initial encounter: Secondary | ICD-10-CM | POA: Diagnosis not present

## 2017-03-05 DIAGNOSIS — F909 Attention-deficit hyperactivity disorder, unspecified type: Secondary | ICD-10-CM | POA: Diagnosis not present

## 2017-03-05 DIAGNOSIS — Z79899 Other long term (current) drug therapy: Secondary | ICD-10-CM | POA: Diagnosis not present

## 2017-03-05 DIAGNOSIS — M7989 Other specified soft tissue disorders: Secondary | ICD-10-CM | POA: Diagnosis not present

## 2017-03-05 MED ORDER — ACETAMINOPHEN 500 MG PO TABS: 1000.0000 mg | ORAL_TABLET | Freq: Once | ORAL | Status: DC

## 2017-03-05 NOTE — ED Provider Notes (Signed)
[facility] Provider Note   CSN: [id number] Arrival date & time: 03/05/17  1749  By signing my name below, I, Dora Sims, attest that this documentation has been prepared under the direction and in the presence of physician practitioner, Gareth Morgan, MD. Electronically Signed: Dora Sims, Scribe. 03/05/2017. 6:56 PM.  History   Chief Complaint Chief Complaint  Patient presents with  . Elbow Injury   The history is provided by the patient. No language interpreter was used.   HPI Comments: Crystal [name] is a 26 y.o. female who presents to the Emergency Department for evaluation of a left elbow injury sustained this afternoon. She was taking out the trash and inadvertently struck her left elbow against the corner of a wall. Patient immediately experienced significant left elbow pain followed by a gradual onset of swelling. The pain is severely exacerbated by flexion and extension of the left arm. It radiates proximally. There are no alleviating factors noted; she took ibuprofen prior to arrival without relief. She has no prior h/o left elbow injuries. Patient denies numbness/tingling, focal weakness, bruising, open wounds, or any other associated symptoms. She intends to follow-up with her PCP should her pain persist.  Past Medical History:  Diagnosis Date  . Anal fissure 10/09/2011   Symptoms began in February 2013. Diagnosed by rectal and anoscopic examination 10/09/2011. Diltiazem gel begun.   . Benign neoplasm of eyelid including canthus   . Dyspepsia    functional  . GERD (gastroesophageal reflux disease)   . Herpes simplex type 1 antibody positive   . Internal hemorrhoids 10/09/2011  . Irritable bowel syndrome   . Ovarian cyst   . UTI (lower urinary tract infection)   . Yeast infection     Patient Active Problem List   Diagnosis Date Noted  . Right knee pain 11/19/2015  . Bleeding internal hemorrhoids 11/27/2014  . ADD (attention deficit disorder)  10/01/2014  . Generalized abdominal pain 07/09/2014  . Axillary lymphadenopathy 05/18/2014  . Anxiety and depression 04/14/2014  . Fatigue 04/14/2014  . Routine general medical examination at a health care facility 03/25/2013  . IBS (irritable bowel syndrome)  02/25/2013  . 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 Functional dyspepsia 08/29/2010    Past Surgical History:  Procedure Laterality Date  . LAPAROSCOPY  12/19/10  . MYRINGOTOMY     x2  . NASAL SINUS SURGERY    . TONSILLECTOMY    . TYMPANOPLASTY  2000  . TYMPANOSTOMY    . UPPER GASTROINTESTINAL ENDOSCOPY  10/17/2010   normal    OB History    No data available       Home Medications    Prior to Admission medications   Medication Sig Start Date End Date Taking? Authorizing Provider  citalopram (CELEXA) 20 MG tablet TAKE 1 TABLET (20 MG TOTAL) BY MOUTH DAILY. 11/14/16   Debbrah Alar, NP  desogestrel-ethinyl estradiol (KARIVA,AZURETTE,MIRCETTE) 0.15-0.02/0.01 MG (21/5) tablet Take 1 tablet by mouth daily.      [provider]  dicyclomine (BENTYL) 10 MG capsule Take 1 capsule (10 mg total) by mouth 3 (three) times daily as needed for spasms. 10/16/16   Quintella Reichert, MD  ondansetron (ZOFRAN ODT) 4 MG disintegrating tablet Take 1 tablet (4 mg total) by mouth every 6 (six) hours as needed for nausea or vomiting. 10/16/16   Quintella Reichert, MD  pantoprazole (PROTONIX) 40 MG tablet Take 1 tablet (40 mg total) by mouth daily. 07/18/16   Debbrah Alar, NP  valACYclovir (VALTREX) 1000 MG tablet Take 1 tablet (1,000 mg total) by mouth daily. 10/19/16   Saguier, Percell Miller, PA-C    Family History Family History  Problem Relation Age of Onset  . Diabetes Mother   . Hypertension Mother   . Anxiety disorder Mother   . Irritable bowel syndrome Father   . Depression Father   . Anxiety disorder Father   . Breast cancer Maternal Grandmother   . Heart disease Maternal Grandfather   . Brain cancer  Paternal Grandmother        tumor  . Colon cancer Neg Hx     Social History Social History  Substance Use Topics  . Smoking status: Never Smoker  . Smokeless tobacco: Never Used  . Alcohol use 0.0 oz/week     Comment: occasional     Allergies   Cymbalta [duloxetine hcl]   Review of Systems Review of Systems  Constitutional: Negative for fever.  Respiratory: Negative for shortness of breath.   Cardiovascular: Negative for chest pain.  Gastrointestinal: Negative for abdominal pain.  Genitourinary: Negative for difficulty urinating.  Musculoskeletal: Positive for arthralgias and joint swelling. Negative for back pain and neck pain.  Skin: Negative for color change, rash and wound.  Neurological: Negative for weakness and numbness.   Physical Exam Updated Vital Signs BP (!) 144/102 (BP Location: Right Arm)   Pulse 73   Temp 98.2 F (36.8 C) (Oral)   Resp 16   Ht 5\' 6"  (1.676 m)   Wt 127 lb (57.6 kg)   LMP 03/05/2017   SpO2 98%   BMI 20.50 kg/m   Physical Exam  Constitutional: She is oriented to person, place, and time. She appears well-developed and well-nourished. No distress.  HENT:  Head: Normocephalic and atraumatic.  Eyes: Conjunctivae and EOM are normal.  Neck: Neck supple. No tracheal deviation present.  Cardiovascular: Normal rate.   Pulmonary/Chest: Effort normal. No respiratory distress.  Musculoskeletal:       Left elbow: She exhibits decreased range of motion. She exhibits no swelling, no effusion, no deformity and no laceration. Tenderness found. Lateral epicondyle tenderness noted. No radial head, no medial epicondyle and no olecranon process tenderness noted.       Left hand: Normal sensation noted. Decreased sensation is not present in the ulnar distribution, is not present in the medial redistribution and is not present in the radial distribution. Normal strength noted. She exhibits no finger abduction, no thumb/finger opposition and no wrist  extension trouble.  Neurological: She is alert and oriented to person, place, and time.  Skin: Skin is warm and dry.  Psychiatric: She has a normal mood and affect. Her behavior is normal.  Nursing note and vitals reviewed.  ED Treatments / Results  Labs (all labs ordered are listed, but only abnormal results are displayed) Labs Reviewed - No data to display  EKG  EKG Interpretation None       Radiology Dg Elbow Complete Left  Result Date: 03/05/2017 CLINICAL DATA:  Struck LEFT elbow on the corner of a wall this evening, posterior pain increased with extension, injury EXAM: LEFT ELBOW - COMPLETE 3+ VIEW COMPARISON:  None FINDINGS: Osseous mineralization normal. Joint spaces preserved. No fracture, dislocation, or bone destruction. No joint effusion. Minimal dorsal soft tissue swelling overlying the olecranon. IMPRESSION: No acute osseous abnormalities. Electronically Signed   By: Lavonia Dana M.D.   On: 03/05/2017 18:08    Procedures Procedures (including critical care time)  DIAGNOSTIC STUDIES: Oxygen Saturation is 98%  on RA, normal by my interpretation.    COORDINATION OF CARE: 6:51 PM Discussed treatment plan with pt at bedside and pt agreed to plan.  Medications Ordered in ED Medications - No data to display   Initial Impression / Assessment and Plan / ED Course  I have reviewed the triage vital signs and the nursing notes.  Pertinent labs & imaging results that were available during my care of the patient were reviewed by me and considered in my medical decision making (see chart for details).      26yo female presents with concern for left elbow pain after hitting it on the corner of the wall.  XR shows no sign of fracture. She is neurovascularly intact. No sign of olecranon bursitis on exam. Suspect contusion to elbow, however discussed if pain not improving to see PCP for repeat imaging. Placed in sling for comfort, recommend tylenol/ibuprofen for pain, ice and  elevation. Gave work note. (she is scheduled to work in this ED tomorrow.) Patient discharged in stable condition with understanding of reasons to return.   Final Clinical Impressions(s) / ED Diagnoses   Final diagnoses:  Left elbow pain    New Prescriptions New Prescriptions   No medications on file  I personally performed the services described in this documentation, which was scribed in my presence. The recorded information has been reviewed and is accurate.    Gareth Morgan, MD 03/06/17 [phone]

## 2017-03-05 NOTE — ED Triage Notes (Signed)
Pt c/o left elbow injury x 2 hrs ago

## 2017-03-14 ENCOUNTER — Encounter: Payer: Self-pay | Admitting: Family

## 2017-03-14 ENCOUNTER — Ambulatory Visit (INDEPENDENT_AMBULATORY_CARE_PROVIDER_SITE_OTHER): Payer: 59 | Admitting: Family

## 2017-03-14 VITALS — BP 115/79 | HR 68 | Temp 98.1°F | Resp 16 | Ht 66.0 in | Wt 126.8 lb

## 2017-03-14 DIAGNOSIS — F418 Other specified anxiety disorders: Secondary | ICD-10-CM

## 2017-03-14 DIAGNOSIS — K581 Irritable bowel syndrome with constipation: Secondary | ICD-10-CM | POA: Diagnosis not present

## 2017-03-14 DIAGNOSIS — F329 Major depressive disorder, single episode, unspecified: Secondary | ICD-10-CM

## 2017-03-14 DIAGNOSIS — F419 Anxiety disorder, unspecified: Secondary | ICD-10-CM | POA: Diagnosis not present

## 2017-03-14 MED ORDER — CITALOPRAM HYDROBROMIDE 40 MG PO TABS: 40.0000 mg | ORAL_TABLET | Freq: Every day | ORAL | 1 refills | Status: DC

## 2017-03-14 MED ORDER — LINACLOTIDE 72 MCG PO CAPS: 72.0000 ug | ORAL_CAPSULE | Freq: Every day | ORAL | 5 refills | Status: DC

## 2017-03-14 MED FILL — CITALOPRAM HBR 40 MG TABLET: 40 | 30 days supply | Qty: 30 | Fill #0

## 2017-03-14 MED FILL — LINZESS 72 MCG CAPSULE: 72 | 90 days supply | Qty: 90 | Fill #0

## 2017-03-14 NOTE — Progress Notes (Signed)
Subjective:    Patient ID: Crystal [name], female    DOB: Jun 10, 1991, 26 y.o.   MRN: [id number]  HPI  Crystal [name] is a 26 yr old female who presents today for follow up of her anxiety/depression.  Reports that she has been under a lot of stress recently. Working as an Public relations account executive.  She is working on getting in to [employer].  Husband lost his job.  Reports that she is sleeping OK, has trouble falling asleep. Reports that she has trouble getting out of bed on her days off.  Some trouble motivating. Reports that she gets very anxious and wants to "scream." Happens at home and at work.     IBS- constipation- reports that she can go up to a week without bm. Would like to restart linzess Review of Systems See HPI  Past Medical History:  Diagnosis Date  . Anal fissure 10/09/2011   Symptoms began in February 2013. Diagnosed by rectal and anoscopic examination 10/09/2011. Diltiazem gel begun.   . Benign neoplasm of eyelid including canthus   . Dyspepsia    functional  . GERD (gastroesophageal reflux disease)   . Herpes simplex type 1 antibody positive   . Internal hemorrhoids 10/09/2011  . Irritable bowel syndrome   . Ovarian cyst   . UTI (lower urinary tract infection)   . Yeast infection      Social History   Social History  . Marital status: Single    Spouse name: N/A  . Number of children: N/A  . Years of education: N/A   Occupational History  . Not on file.   Social History Main Topics  . Smoking status: Never Smoker  . Smokeless tobacco: Never Used  . Alcohol use 0.0 oz/week     Comment: occasional  . Drug use: No  . Sexual activity: Yes    Birth control/ protection: Pill   Other Topics Concern  . Not on file   Social History Narrative   Parents who are both [place] RN's   Works at [employer]   Works as a Chartered certified accountant at [employer]          Past Surgical History:  Procedure Laterality Date  . LAPAROSCOPY  12/19/10  . MYRINGOTOMY     x2  . NASAL SINUS SURGERY    .  TONSILLECTOMY    . TYMPANOPLASTY  2000  . TYMPANOSTOMY    . UPPER GASTROINTESTINAL ENDOSCOPY  10/17/2010   normal    Family History  Problem Relation Age of Onset  . Diabetes Mother   . Hypertension Mother   . Anxiety disorder Mother   . Irritable bowel syndrome Father   . Depression Father   . Anxiety disorder Father   . Breast cancer Maternal Grandmother   . Heart disease Maternal Grandfather   . Brain cancer Paternal Grandmother        tumor  . Colon cancer Neg Hx     Allergies  Allergen Reactions  . Cymbalta [Duloxetine Hcl] Nausea Only    Current Outpatient Prescriptions on File Prior to Visit  Medication Sig Dispense Refill  . citalopram (CELEXA) 20 MG tablet TAKE 1 TABLET (20 MG TOTAL) BY MOUTH DAILY. 30 tablet 0  . desogestrel-ethinyl estradiol (KARIVA,AZURETTE,MIRCETTE) 0.15-0.02/0.01 MG (21/5) tablet Take 1 tablet by mouth daily.      Marland Kitchen dicyclomine (BENTYL) 10 MG capsule Take 1 capsule (10 mg total) by mouth 3 (three) times daily as needed for spasms. 15 capsule 0  .  ondansetron (ZOFRAN ODT) 4 MG disintegrating tablet Take 1 tablet (4 mg total) by mouth every 6 (six) hours as needed for nausea or vomiting. 10 tablet 0  . pantoprazole (PROTONIX) 40 MG tablet Take 1 tablet (40 mg total) by mouth daily. 30 tablet 3  . valACYclovir (VALTREX) 1000 MG tablet Take 1 tablet (1,000 mg total) by mouth daily. 30 tablet 0   No current facility-administered medications on file prior to visit.     BP 115/79 (BP Location: Left Arm, Cuff Size: Normal)   Pulse 68   Temp 98.1 F (36.7 C) (Oral)   Resp 16   Ht 5\' 6"  (1.676 m)   Wt 126 lb 12.8 oz (57.5 kg)   LMP 03/05/2017   SpO2 100%   BMI 20.47 kg/m       Objective:   Physical Exam  Constitutional: She is oriented to person, place, and time. She appears well-developed and well-nourished.  Cardiovascular: Normal rate, regular rhythm and normal heart sounds.   No murmur heard. Pulmonary/Chest: Effort normal and breath  sounds normal. No respiratory distress. She has no wheezes.  Abdominal: Soft. Bowel sounds are normal. She exhibits no distension. There is no tenderness.  Musculoskeletal: She exhibits no edema.  Neurological: She is alert and oriented to person, place, and time.  Psychiatric: She has a normal mood and affect. Her behavior is normal. Judgment and thought content normal.          Assessment & Plan:  Anxiety/depression- scored 5 on PHQ-9.  Seems to have a fair amount of anxiety. Will increase citalopram from 20mg  to 40mg .  Advised pt to consider getting established with a therapist.   IBS-Constipation- will restart linzess.

## 2017-03-14 NOTE — Patient Instructions (Addendum)
Please increase citalopram from 20mg  to 40mg .  Consider getting established with a counselor- [place].

## 2017-04-09 MED FILL — LEVONOR-ETH ESTRAD 0.1-0.02: 0.1-20 | 84 days supply | Qty: 84 | Fill #0

## 2017-04-25 ENCOUNTER — Encounter: Payer: Self-pay | Admitting: Family

## 2017-04-25 ENCOUNTER — Ambulatory Visit (INDEPENDENT_AMBULATORY_CARE_PROVIDER_SITE_OTHER): Payer: 59 | Admitting: Family

## 2017-04-25 VITALS — BP 105/67 | HR 64 | Temp 98.4°F | Resp 16 | Ht 66.0 in | Wt 125.6 lb

## 2017-04-25 DIAGNOSIS — K589 Irritable bowel syndrome without diarrhea: Secondary | ICD-10-CM

## 2017-04-25 DIAGNOSIS — F419 Anxiety disorder, unspecified: Secondary | ICD-10-CM

## 2017-04-25 DIAGNOSIS — F32A Depression, unspecified: Secondary | ICD-10-CM

## 2017-04-25 DIAGNOSIS — F329 Major depressive disorder, single episode, unspecified: Secondary | ICD-10-CM | POA: Diagnosis not present

## 2017-04-25 MED ORDER — CITALOPRAM HYDROBROMIDE 40 MG PO TABS: 40.0000 mg | ORAL_TABLET | Freq: Every day | ORAL | 1 refills | Status: DC

## 2017-04-25 MED FILL — CITALOPRAM HBR 40 MG TABLET: 40 | 90 days supply | Qty: 90 | Fill #0

## 2017-04-25 NOTE — Assessment & Plan Note (Signed)
Improved on current dose of citalopram. Suggested that she add melatonin due to c/o insomnia.

## 2017-04-25 NOTE — Progress Notes (Signed)
Subjective:    Patient ID: Crystal [name], female    DOB: 03-10-1991, 26 y.o.   MRN: [id number]  HPI   Crystal [name] is a 26 yr old female who presents today for follow up of depression/anxiety.  Last visit she noted increased symptoms despite citalopram 20mg . We increased her dose to 40mg .  Reports that she feeling less irritable.  Less stress as her husband will soon be starting a job.  Reports that she falls asleep but has trouble staying asleep.   IBS- She noted uncontrolled constipation. We restarted linzess last visit. Reports that she has a BM nearly every day now.  Prior to linzess she was going once a week or once twice a month.  Feels less hungry since she started linzess but she feels ok with this since constipation is better controlled.  Wt Readings from Last 3 Encounters:  04/25/17 125 lb 9.6 oz (57 kg)  03/14/17 126 lb 12.8 oz (57.5 kg)  03/05/17 127 lb (57.6 kg)     Review of Systems See HPI  Past Medical History:  Diagnosis Date  . Anal fissure 10/09/2011   Symptoms began in February 2013. Diagnosed by rectal and anoscopic examination 10/09/2011. Diltiazem gel begun.   . Benign neoplasm of eyelid including canthus   . Dyspepsia    functional  . GERD (gastroesophageal reflux disease)   . Herpes simplex type 1 antibody positive   . Internal hemorrhoids 10/09/2011  . Irritable bowel syndrome   . Ovarian cyst   . UTI (lower urinary tract infection)   . Yeast infection      Social History   Social History  . Marital status: Single    Spouse name: N/A  . Number of children: N/A  . Years of education: N/A   Occupational History  . Not on file.   Social History Main Topics  . Smoking status: Never Smoker  . Smokeless tobacco: Never Used  . Alcohol use 0.0 oz/week     Comment: occasional  . Drug use: No  . Sexual activity: Yes    Birth control/ protection: Pill   Other Topics Concern  . Not on file   Social History Narrative   Parents who are both  [place] RN's   Works at [employer]   Works as a Chartered certified accountant at [employer]          Past Surgical History:  Procedure Laterality Date  . LAPAROSCOPY  12/19/10  . MYRINGOTOMY     x2  . NASAL SINUS SURGERY    . TONSILLECTOMY    . TYMPANOPLASTY  2000  . TYMPANOSTOMY    . UPPER GASTROINTESTINAL ENDOSCOPY  10/17/2010   normal    Family History  Problem Relation Age of Onset  . Diabetes Mother   . Hypertension Mother   . Anxiety disorder Mother   . Irritable bowel syndrome Father   . Depression Father   . Anxiety disorder Father   . Breast cancer Maternal Grandmother   . Heart disease Maternal Grandfather   . Brain cancer Paternal Grandmother        tumor  . Colon cancer Neg Hx     Allergies  Allergen Reactions  . Cymbalta [Duloxetine Hcl] Nausea Only    Current Outpatient Prescriptions on File Prior to Visit  Medication Sig Dispense Refill  . citalopram (CELEXA) 40 MG tablet Take 1 tablet (40 mg total) by mouth daily. 30 tablet 1  . dicyclomine (BENTYL) 10 MG  capsule Take 1 capsule (10 mg total) by mouth 3 (three) times daily as needed for spasms. 15 capsule 0  . linaclotide (LINZESS) 72 MCG capsule Take 1 capsule (72 mcg total) by mouth daily before breakfast. 30 capsule 5  . ondansetron (ZOFRAN ODT) 4 MG disintegrating tablet Take 1 tablet (4 mg total) by mouth every 6 (six) hours as needed for nausea or vomiting. 10 tablet 0  . pantoprazole (PROTONIX) 40 MG tablet Take 1 tablet (40 mg total) by mouth daily. 30 tablet 3  . valACYclovir (VALTREX) 1000 MG tablet Take 1 tablet (1,000 mg total) by mouth daily. 30 tablet 0   No current facility-administered medications on file prior to visit.     BP 105/67 (BP Location: Right Arm, Cuff Size: Normal)   Pulse 64   Temp 98.4 F (36.9 C) (Oral)   Resp 16   Ht 5\' 6"  (1.676 m)   Wt 125 lb 9.6 oz (57 kg)   LMP 04/04/2017   SpO2 97%   BMI 20.27 kg/m       Objective:   Physical Exam  Constitutional: She is oriented  to person, place, and time. She appears well-developed and well-nourished.  HENT:  Head: Normocephalic and atraumatic.  Cardiovascular: Normal rate, regular rhythm and normal heart sounds.   No murmur heard. Pulmonary/Chest: Effort normal and breath sounds normal. No respiratory distress. She has no wheezes.  Neurological: She is alert and oriented to person, place, and time.  Psychiatric: She has a normal mood and affect. Her behavior is normal. Judgment and thought content normal.          Assessment & Plan:

## 2017-04-25 NOTE — Assessment & Plan Note (Signed)
Much improved on linzess. Continue same.

## 2017-04-25 NOTE — Patient Instructions (Addendum)
Please add melatonin 5 mg one hour before bedtime.  Please schedule a physical at your convenience.

## 2017-04-30 DIAGNOSIS — Z01419 Encounter for gynecological examination (general) (routine) without abnormal findings: Secondary | ICD-10-CM | POA: Diagnosis not present

## 2017-04-30 DIAGNOSIS — Z113 Encounter for screening for infections with a predominantly sexual mode of transmission: Secondary | ICD-10-CM | POA: Diagnosis not present

## 2017-04-30 DIAGNOSIS — Z6820 Body mass index (BMI) 20.0-20.9, adult: Secondary | ICD-10-CM | POA: Diagnosis not present

## 2017-06-14 MED FILL — LINZESS 72 MCG CAPSULE: 72 | 90 days supply | Qty: 90 | Fill #1

## 2017-07-02 MED FILL — LEVONOR-ETH ESTRAD 0.1-0.02: 0.1-20 | 84 days supply | Qty: 84 | Fill #0

## 2017-07-18 DIAGNOSIS — N907 Vulvar cyst: Secondary | ICD-10-CM | POA: Diagnosis not present

## 2017-07-18 MED FILL — CEPHALEXIN 500 MG CAPSULE: 500 | 7 days supply | Qty: 21 | Fill #0

## 2017-09-18 ENCOUNTER — Other Ambulatory Visit: Payer: Self-pay | Admitting: Family

## 2017-09-18 MED FILL — LEVONOR-ETH ESTRAD 0.1-0.02: 0.1-20 | 84 days supply | Qty: 84 | Fill #1

## 2017-09-18 MED FILL — LINZESS 72 MCG CAPSULE: 72 | 60 days supply | Qty: 60 | Fill #0

## 2017-09-21 ENCOUNTER — Encounter: Payer: Self-pay | Admitting: Family

## 2017-09-21 ENCOUNTER — Ambulatory Visit (INDEPENDENT_AMBULATORY_CARE_PROVIDER_SITE_OTHER): Payer: 59 | Admitting: Family

## 2017-09-21 VITALS — BP 116/79 | HR 68 | Temp 97.5°F | Resp 16 | Ht 66.0 in | Wt 126.4 lb

## 2017-09-21 DIAGNOSIS — Z0000 Encounter for general adult medical examination without abnormal findings: Secondary | ICD-10-CM

## 2017-09-21 DIAGNOSIS — R519 Headache, unspecified: Secondary | ICD-10-CM

## 2017-09-21 DIAGNOSIS — R51 Headache: Secondary | ICD-10-CM

## 2017-09-21 MED ORDER — KETOROLAC TROMETHAMINE 60 MG/2ML IM SOLN
60.0000 mg | Freq: Once | INTRAMUSCULAR | Status: AC
  Administered 2017-09-21: 60 mg via INTRAMUSCULAR

## 2017-09-21 NOTE — Progress Notes (Signed)
Subjective:    Patient ID: Crystal [name], female    DOB: 1990/09/02, 27 y.o.   MRN: [id number]  HPI  Patient presents today for complete physical.  Immunizations: flu shot up to date, tetanus 2014 Diet: healthy, eating high protein. Wt Readings from Last 3 Encounters:  09/21/17 126 lb 6.4 oz (57.3 kg)  04/25/17 125 lb 9.6 oz (57 kg)  03/14/17 126 lb 12.8 oz (57.5 kg)  Exercise: almost every day, weighs/cardio Pap Smear:  03/01/15- has GYN. Dental: due [facility]: 2 yrs ago  HA- has been present x 2 days. Using naproxen, tylenol, mucinex, sudafed. No associated nausea  Has period today  Review of Systems  Constitutional: Negative for fever and unexpected weight change.  HENT: Negative for rhinorrhea.   Respiratory: Negative for cough.   Cardiovascular: Negative for leg swelling.  Gastrointestinal: Negative for constipation and nausea.       Some diarrhea due to linzess  Genitourinary: Negative for dysuria, frequency and menstrual problem.  Musculoskeletal: Negative for arthralgias and myalgias.  Skin: Negative for rash.  Neurological: Positive for headaches.  Hematological: Negative for adenopathy.  Psychiatric/Behavioral:       Mood is good, denies anxiety   Past Medical History:  Diagnosis Date  . Anal fissure 10/09/2011   Symptoms began in February 2013. Diagnosed by rectal and anoscopic examination 10/09/2011. Diltiazem gel begun.   . Benign neoplasm of eyelid including canthus   . Dyspepsia    functional  . GERD (gastroesophageal reflux disease)   . Herpes simplex type 1 antibody positive   . Internal hemorrhoids 10/09/2011  . Irritable bowel syndrome   . Ovarian cyst   . UTI (lower urinary tract infection)   . Yeast infection      Social History   Socioeconomic History  . Marital status: Married    Spouse name: Not on file  . Number of children: Not on file  . Years of education: Not on file  . Highest education level: Not on file  Social Needs  .  Financial resource strain: Not on file  . Food insecurity - worry: Not on file  . Food insecurity - inability: Not on file  . Transportation needs - medical: Not on file  . Transportation needs - non-medical: Not on file  Occupational History  . Not on file  Tobacco Use  . Smoking status: Never Smoker  . Smokeless tobacco: Never Used  Substance and Sexual Activity  . Alcohol use: Yes    Alcohol/week: 0.0 oz    Comment: occasional  . Drug use: No  . Sexual activity: Yes    Birth control/protection: Pill  Other Topics Concern  . Not on file  Social History Narrative   Parents who are both [place] RN's   Works at [employer]   Works as a Chartered certified accountant at [employer]       Past Surgical History:  Procedure Laterality Date  . LAPAROSCOPY  12/19/10  . MYRINGOTOMY     x2  . NASAL SINUS SURGERY    . TONSILLECTOMY    . TYMPANOPLASTY  2000  . TYMPANOSTOMY    . UPPER GASTROINTESTINAL ENDOSCOPY  10/17/2010   normal    Family History  Problem Relation Age of Onset  . Diabetes Mother   . Hypertension Mother   . Anxiety disorder Mother   . Irritable bowel syndrome Father   . Depression Father   . Anxiety disorder Father   . Breast cancer Maternal  Grandmother   . Heart disease Maternal Grandfather   . Brain cancer Paternal Grandmother        tumor  . Colon cancer Neg Hx     Allergies  Allergen Reactions  . Cymbalta [Duloxetine Hcl] Nausea Only    Current Outpatient Medications on File Prior to Visit  Medication Sig Dispense Refill  . citalopram (CELEXA) 40 MG tablet Take 1 tablet (40 mg total) by mouth daily. 90 tablet 1  . dicyclomine (BENTYL) 10 MG capsule Take 1 capsule (10 mg total) by mouth 3 (three) times daily as needed for spasms. 15 capsule 0  . levonorgestrel-ethinyl estradiol (AVIANE,ALESSE,LESSINA) 0.1-20 MG-MCG tablet Take 1 tablet by mouth daily.  0  . LINZESS 72 MCG capsule TAKE ONE CAPSULE BY MOUTH DAILY BEFORE BREAKFAST 30 capsule 1  . ondansetron  (ZOFRAN ODT) 4 MG disintegrating tablet Take 1 tablet (4 mg total) by mouth every 6 (six) hours as needed for nausea or vomiting. 10 tablet 0  . pantoprazole (PROTONIX) 40 MG tablet Take 1 tablet (40 mg total) by mouth daily. 30 tablet 3  . valACYclovir (VALTREX) 1000 MG tablet Take 1 tablet (1,000 mg total) by mouth daily. 30 tablet 0   No current facility-administered medications on file prior to visit.     BP 116/79 (BP Location: Right Arm, Patient Position: Sitting, Cuff Size: Normal)   Pulse 68   Temp (!) 97.5 F (36.4 C) (Oral)   Resp 16   Ht 5\' 6"  (1.676 m)   Wt 126 lb 6.4 oz (57.3 kg)   SpO2 100%   BMI 20.40 kg/m       Objective:   Physical Exam  Physical Exam  Constitutional: appears suntanned. She is oriented to person, place, and time. She appears well-developed and well-nourished. No distress.  HENT:  Head: Normocephalic and atraumatic.  Right Ear: Tympanic membrane and ear canal normal.  Left Ear: Tympanic membrane and ear canal normal.  Mouth/Throat: Oropharynx is clear and moist.  Eyes: Pupils are equal, round, and reactive to light. No scleral icterus.  Neck: Normal range of motion. No thyromegaly present.  Cardiovascular: Normal rate and regular rhythm.   No murmur heard. Pulmonary/Chest: Effort normal and breath sounds normal. No respiratory distress. He has no wheezes. She has no rales. She exhibits no tenderness.  Abdominal: Soft. Bowel sounds are normal. She exhibits no distension and no mass. There is no tenderness. There is no rebound and no guarding.  Musculoskeletal: She exhibits no edema.  Lymphadenopathy:    She has no cervical adenopathy.  Neurological: She is alert and oriented to person, place, and time. She has normal patellar reflexes. She exhibits normal muscle tone. Coordination normal.  Skin: Skin is warm and dry.  Psychiatric: She has a normal mood and affect. Her behavior is normal. Judgment and thought content normal.  Breasts: Examined  lying Right: Without masses, retractions, discharge or axillary adenopathy.  Left: Without masses, retractions, discharge or axillary adenopathy.            Assessment & Plan:    Preventative care- encouraged pt to continue healthy diet, exercise. Obtain routine lab work.  Also discussed scheduling routine dental. Pap up to date- sees gyn.  Headache- new.  Will give IM Toradol. She is advised to let us know if HA worsens or fails to improve.      Assessment & Plan:

## 2017-09-21 NOTE — Addendum Note (Signed)
Addended by: Jiles Prows on: 09/21/2017 02:42 PM   Modules accepted: Orders

## 2017-09-21 NOTE — Patient Instructions (Addendum)
Please complete lab work prior to leaving. Continue healthy diet and regular exercise. Call if headache worsens or if headache does not improve after toradol.

## 2017-10-19 MED FILL — CITALOPRAM HBR 40 MG TABLET: 40 | 90 days supply | Qty: 90 | Fill #1

## 2017-10-22 ENCOUNTER — Encounter: Payer: Self-pay | Admitting: *Deleted

## 2017-10-22 ENCOUNTER — Emergency Department
Admission: EM | Admit: 2017-10-22 | Discharge: 2017-10-22 | Disposition: A | Discharge status: Home / Self Care | Payer: 59 | Source: Home / Self Care

## 2017-10-22 ENCOUNTER — Other Ambulatory Visit: Payer: Self-pay

## 2017-10-22 DIAGNOSIS — J0100 Acute maxillary sinusitis, unspecified: Secondary | ICD-10-CM | POA: Diagnosis not present

## 2017-10-22 DIAGNOSIS — J069 Acute upper respiratory infection, unspecified: Secondary | ICD-10-CM

## 2017-10-22 DIAGNOSIS — J029 Acute pharyngitis, unspecified: Secondary | ICD-10-CM | POA: Diagnosis not present

## 2017-10-22 DIAGNOSIS — B9789 Other viral agents as the cause of diseases classified elsewhere: Secondary | ICD-10-CM | POA: Diagnosis not present

## 2017-10-22 LAB — POCT RAPID STREP A (OFFICE): Rapid Strep A Screen: NEGATIVE

## 2017-10-22 MED ORDER — AMOXICILLIN-POT CLAVULANATE 875-125 MG PO TABS: 1.0000 | ORAL_TABLET | Freq: Two times a day (BID) | ORAL | 0 refills | Status: DC

## 2017-10-22 MED ORDER — FLUCONAZOLE 150 MG PO TABS: 150.0000 mg | ORAL_TABLET | Freq: Once | ORAL | 0 refills | Status: AC

## 2017-10-22 MED FILL — AMOX-CLAV 875-125 MG TABLET: 875-125 | 7 days supply | Qty: 14 | Fill #0

## 2017-10-22 MED FILL — FLUCONAZOLE 150 MG TABS: 150 | 2 days supply | Qty: 2 | Fill #0

## 2017-10-22 NOTE — Discharge Instructions (Signed)
Take plenty of fluids and get enough rest  Take Augmentin (amoxicillin/clavulanate) 1 twice daily for 1 week  Take the Diflucan if needed for vaginal yeast symptoms.  However taking Diflucan can increase the blood level of your citalopram, so you may want to take only half a dose of citalopram on the days that you take the Diflucan.  Tylenol or ibuprofen if needed for pain  Take an antihistamine decongestant such as Claritin-D or Allegra-D if needed for head congestion and drainage.  Avoid smoking environments.  Return if needed

## 2017-10-22 NOTE — ED Provider Notes (Signed)
[facility]    CSN: [id number] Arrival date & time: 10/22/17  1257     History   Chief Complaint Chief Complaint  Patient presents with  . Sore Throat  . Nasal Congestion    HPI Crystal [name] is a 27 y.o. female.  For about 10 days she has been having respiratory tract symptoms.  It started with a sore throat, then a day or 2 later she got head congestion, then a cough, symptoms seem to keep fluctuating some from day today.  She continues with a sore throat.  She is blowing and coughing green mucus.  She has not been febrile.  She has some frontal and maxillary area discomfort and pain when she bends over.  She does not smoke.  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 HPI  Past Medical History:  Diagnosis Date  . Anal fissure 10/09/2011   Symptoms began in February 2013. Diagnosed by rectal and anoscopic examination 10/09/2011. Diltiazem gel begun.   . Benign neoplasm of eyelid including canthus   . Dyspepsia    functional  . GERD (gastroesophageal reflux disease)   . Herpes simplex type 1 antibody positive   . Internal hemorrhoids 10/09/2011  . Irritable bowel syndrome   . Ovarian cyst   . UTI (lower urinary tract infection)   . Yeast infection     Patient Active Problem List   Diagnosis Date Noted  . Right knee pain 11/19/2015  . Bleeding internal hemorrhoids 11/27/2014  . ADD (attention deficit disorder) 10/01/2014  . Generalized abdominal pain 07/09/2014  . Axillary lymphadenopathy 05/18/2014  . Anxiety and depression 04/14/2014  . Fatigue 04/14/2014  . Routine general medical examination at a health care facility 03/25/2013  . IBS (irritable bowel syndrome)  02/25/2013  . 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 Functional dyspepsia 08/29/2010    Past Surgical History:  Procedure Laterality Date  . LAPAROSCOPY  12/19/10  . MYRINGOTOMY     x2  . NASAL SINUS SURGERY    .  TONSILLECTOMY    . TYMPANOPLASTY  2000  . TYMPANOSTOMY    . UPPER GASTROINTESTINAL ENDOSCOPY  10/17/2010   normal    OB History   None      Home Medications    Prior to Admission medications   Medication Sig Start Date End Date Taking? Authorizing Provider  citalopram (CELEXA) 40 MG tablet Take 1 tablet (40 mg total) by mouth daily. 04/25/17   Debbrah Alar, NP  dicyclomine (BENTYL) 10 MG capsule Take 1 capsule (10 mg total) by mouth 3 (three) times daily as needed for spasms. 10/16/16   Quintella Reichert, MD  levonorgestrel-ethinyl estradiol (AVIANE,ALESSE,LESSINA) 0.1-20 MG-MCG tablet Take 1 tablet by mouth daily. 04/09/17   [provider]  LINZESS 72 MCG capsule TAKE ONE CAPSULE BY MOUTH DAILY BEFORE BREAKFAST 09/18/17   Debbrah Alar, NP  ondansetron (ZOFRAN ODT) 4 MG disintegrating tablet Take 1 tablet (4 mg total) by mouth every 6 (six) hours as needed for nausea or vomiting. 10/16/16   Quintella Reichert, MD  pantoprazole (PROTONIX) 40 MG tablet Take 1 tablet (40 mg total) by mouth daily. 07/18/16   Debbrah Alar, NP  valACYclovir (VALTREX) 1000 MG tablet Take 1 tablet (1,000 mg total) by mouth daily. 10/19/16   Saguier, Percell Miller, PA-C    Family History Family History  Problem Relation Age of Onset  . Diabetes Mother   .  Hypertension Mother   . Anxiety disorder Mother   . Irritable bowel syndrome Father   . Depression Father   . Anxiety disorder Father   . Breast cancer Maternal Grandmother   . Heart disease Maternal Grandfather   . Brain cancer Paternal Grandmother        tumor  . Colon cancer Neg Hx     Social History Social History   Tobacco Use  . Smoking status: Never Smoker  . Smokeless tobacco: Never Used  Substance Use Topics  . Alcohol use: Yes    Alcohol/week: 0.0 oz    Comment: occasional  . Drug use: No     Allergies   Cymbalta [duloxetine hcl]   Review of Systems Review of Systems Constitutional: Feels bad just from  persistence of symptoms Respiratory: As noted above Cardiovascular: Unremarkable Urinary: She is on birth control.  Physical Exam Triage Vital Signs ED Triage Vitals  Enc Vitals Group     BP 10/22/17 1355 107/74     Pulse Rate 10/22/17 1355 74     Resp 10/22/17 1355 16     Temp 10/22/17 1355 98.3 F (36.8 C)     Temp Source 10/22/17 1355 Oral     SpO2 10/22/17 1355 99 %     Weight 10/22/17 1356 123 lb (55.8 kg)     Height 10/22/17 1356 5\' 6"  (1.676 m)     Head Circumference --      Peak Flow --      Pain Score 10/22/17 1356 0     Pain Loc --      Pain Edu? --      Excl. in [place]? --    No data found.  Updated Vital Signs BP 107/74 (BP Location: Right Arm)   Pulse 74   Temp 98.3 F (36.8 C) (Oral)   Resp 16   Ht 5\' 6"  (1.676 m)   Wt 123 lb (55.8 kg)   LMP 10/15/2017   SpO2 99%   BMI 19.85 kg/m   Visual Acuity Right Eye Distance:   Left Eye Distance:   Bilateral Distance:    Right Eye Near:   Left Eye Near:    Bilateral Near:     Physical Exam  No major acute distress.  TMs normal.  Mild frontal sinus tenderness.  Throat erythematous.  She hurts more on the left than the right but the right is slightly more erythematous.  It is a pattern of erythema coming down from behind the soft palate looking like a postnasal drainage type erythema.  Neck supple without significant nodes.  Chest is clear to auscultation.  Heart regular without murmur.   UC Treatments / Results  Labs (all labs ordered are listed, but only abnormal results are displayed) Labs Reviewed  POCT RAPID STREP A (OFFICE)    EKG None Radiology No results found.  Procedures Procedures (including critical care time)  Medications Ordered in UC Medications - No data to display   Initial Impression / Assessment and Plan / UC Course  I have reviewed the triage vital signs and the nursing notes.  Pertinent labs & imaging results that were available during my care of the patient were reviewed by  me and considered in my medical decision making (see chart for details).     Sinusitis with secondary pharyngitis and cough.  Probably started viral but has been going for 10 days.  Final Clinical Impressions(s) / UC Diagnoses   Final diagnoses:  None  ED Discharge Orders    None       Controlled Substance Prescriptions [place] Controlled Substance Registry consulted? No   Posey Boyer, MD 10/22/17 [phone]

## 2017-10-22 NOTE — ED Triage Notes (Signed)
Pt c/o sore throat, nasal congestion, and nonproductive cough x 10 days. She has used Mucinex, OTC flu/cold medication and a vaporizer without relief.

## 2017-11-28 ENCOUNTER — Other Ambulatory Visit: Payer: Self-pay | Admitting: Medical

## 2017-11-28 ENCOUNTER — Other Ambulatory Visit: Payer: Self-pay | Admitting: Family

## 2017-11-28 MED FILL — LINZESS 72 MCG CAPSULE: 72 | 90 days supply | Qty: 90 | Fill #0

## 2017-11-30 MED ORDER — VALACYCLOVIR HCL 1 G PO TABS: 1000.0000 mg | ORAL_TABLET | Freq: Every day | ORAL | 0 refills | Status: DC

## 2017-11-30 MED FILL — valACYclovir HCL 1 GM TABS: 1 | 30 days supply | Qty: 30 | Fill #0

## 2017-12-17 MED FILL — LEVONOR-ETH ESTRAD 0.1-0.02: 0.1-20 | 84 days supply | Qty: 84 | Fill #2

## 2018-01-22 ENCOUNTER — Encounter: Payer: Self-pay | Admitting: Family

## 2018-02-05 ENCOUNTER — Telehealth: Payer: Self-pay | Admitting: Family

## 2018-02-05 NOTE — Telephone Encounter (Signed)
Copied from [place] [id number]. Topic: Quick Communication - See Telephone Encounter >> Feb 05, 2018  8:33 AM Genella Rife H wrote: CRM for notification. See Telephone encounter for: 02/05/18.  Called patient to reschedule MyChart Physical per pcp

## 2018-02-06 ENCOUNTER — Encounter: Payer: 59 | Admitting: Family

## 2018-02-13 DIAGNOSIS — H52221 Regular astigmatism, right eye: Secondary | ICD-10-CM | POA: Diagnosis not present

## 2018-03-04 ENCOUNTER — Encounter: Payer: Self-pay | Admitting: Family

## 2018-03-04 ENCOUNTER — Ambulatory Visit: Payer: 59 | Admitting: Family

## 2018-03-04 VITALS — BP 111/79 | HR 71 | Temp 98.5°F | Resp 16 | Ht 66.0 in | Wt 123.4 lb

## 2018-03-04 DIAGNOSIS — Z0000 Encounter for general adult medical examination without abnormal findings: Secondary | ICD-10-CM

## 2018-03-04 DIAGNOSIS — Z111 Encounter for screening for respiratory tuberculosis: Secondary | ICD-10-CM | POA: Diagnosis not present

## 2018-03-04 LAB — BASIC METABOLIC PANEL
BUN: 11 mg/dL (ref 6–23)
CO2: 30 mEq/L (ref 19–32)
Calcium: 10.1 mg/dL (ref 8.4–10.5)
Chloride: 100 mEq/L (ref 96–112)
Creatinine, Ser: 0.92 mg/dL (ref 0.40–1.20)
GFR: 78.44 mL/min (ref >60.00)
Glucose, Bld: 75 mg/dL (ref 70–99)
Potassium: 4.5 mEq/L (ref 3.5–5.1)
Sodium: 137 mEq/L (ref 135–145)

## 2018-03-04 LAB — URINALYSIS, ROUTINE W REFLEX MICROSCOPIC
Bilirubin Urine: NEGATIVE
Hgb urine dipstick: NEGATIVE
Ketones, ur: NEGATIVE
Leukocytes, UA: NEGATIVE
Nitrite: NEGATIVE
RBC / HPF: NONE SEEN (ref 0–?)
Specific Gravity, Urine: 1.01 (ref 1.000–1.030)
Total Protein, Urine: NEGATIVE
Urine Glucose: NEGATIVE
Urobilinogen, UA: 0.2 (ref 0.0–1.0)
pH: 8 (ref 5.0–8.0)

## 2018-03-04 LAB — CBC WITH DIFFERENTIAL/PLATELET
Basophils Absolute: 0 10*3/uL (ref 0.0–0.1)
Basophils Relative: 0.7 % (ref 0.0–3.0)
Eosinophils Absolute: 0.1 10*3/uL (ref 0.0–0.7)
Eosinophils Relative: 1.3 % (ref 0.0–5.0)
HCT: 43.6 % (ref 36.0–46.0)
Hemoglobin: 15.3 g/dL — ABNORMAL HIGH (ref 12.0–15.0)
Lymphocytes Relative: 38.3 % (ref 12.0–46.0)
Lymphs Abs: 2 10*3/uL (ref 0.7–4.0)
MCHC: 35 g/dL (ref 30.0–36.0)
MCV: 91.3 fl (ref 78.0–100.0)
Monocytes Absolute: 0.4 10*3/uL (ref 0.1–1.0)
Monocytes Relative: 7.4 % (ref 3.0–12.0)
Neutro Abs: 2.7 10*3/uL (ref 1.4–7.7)
Neutrophils Relative %: 52.3 % (ref 43.0–77.0)
Platelets: 228 10*3/uL (ref 150.0–400.0)
RBC: 4.78 Mil/uL (ref 3.87–5.11)
RDW: 12.8 % (ref 11.5–15.5)
WBC: 5.2 10*3/uL (ref 4.0–10.5)

## 2018-03-04 LAB — LIPID PANEL
Cholesterol: 174 mg/dL (ref 0–200)
HDL: 65 mg/dL (ref >39.00)
LDL Cholesterol: 89 mg/dL (ref 0–99)
NonHDL: 109.02
Total CHOL/HDL Ratio: 3
Triglycerides: 98 mg/dL (ref 0.0–149.0)
VLDL: 19.6 mg/dL (ref 0.0–40.0)

## 2018-03-04 LAB — HEPATIC FUNCTION PANEL
ALT: 11 U/L (ref 0–35)
AST: 14 U/L (ref 0–37)
Albumin: 4.7 g/dL (ref 3.5–5.2)
Alkaline Phosphatase: 47 U/L (ref 39–117)
Bilirubin, Direct: 0.1 mg/dL (ref 0.0–0.3)
Total Bilirubin: 0.9 mg/dL (ref 0.2–1.2)
Total Protein: 7.3 g/dL (ref 6.0–8.3)

## 2018-03-04 LAB — TSH: TSH: 1.93 u[IU]/mL (ref 0.35–4.50)

## 2018-03-04 MED FILL — LINZESS 72 MCG CAPSULE: 72 | 90 days supply | Qty: 90 | Fill #1

## 2018-03-04 NOTE — Patient Instructions (Signed)
Please complete lab work prior to leaving.  Continue healthy diet. Try to add 30 minutes of cardio 5 days a week.  Good luck with school.

## 2018-03-04 NOTE — Progress Notes (Signed)
Subjective:    Patient ID: Crystal [name], female    DOB: [birth year], 27 y.o.   MRN: [id number]  HPI  Patient presents today for complete physical.  Immunizations: tetanus up date Diet:healthy Wt Readings from Last 3 Encounters:  03/04/18 123 lb 6.4 oz (56 kg)  10/22/17 123 lb (55.8 kg)  09/21/17 126 lb 6.4 oz (57.3 kg)  Exercise: active in her job Vision: up to date Dental:  scheduled Pap- scheduled 10/19 with GYN (Dr. Tressia Danas)   Review of Systems  Constitutional: Negative for unexpected weight change.  HENT: Negative for hearing loss and rhinorrhea.   Eyes: Negative for visual disturbance.  Respiratory: Negative for cough.   Cardiovascular: Negative for leg swelling.  Gastrointestinal: Negative for constipation and diarrhea.  Genitourinary: Negative for dysuria, frequency and menstrual problem.  Musculoskeletal: Negative for arthralgias and myalgias.  Skin: Negative for rash.  Neurological: Negative for headaches.  Hematological: Negative for adenopathy.  Psychiatric/Behavioral:       Denies depression, has had some stress with work/school   Past Medical History:  Diagnosis Date  . Anal fissure 10/09/2011   Symptoms began in February 2013. Diagnosed by rectal and anoscopic examination 10/09/2011. Diltiazem gel begun.   . Benign neoplasm of eyelid including canthus   . Dyspepsia    functional  . GERD (gastroesophageal reflux disease)   . Herpes simplex type 1 antibody positive   . Internal hemorrhoids 10/09/2011  . Irritable bowel syndrome   . Ovarian cyst   . UTI (lower urinary tract infection)   . Yeast infection      Social History   Socioeconomic History  . Marital status: Married    Spouse name: Not on file  . Number of children: Not on file  . Years of education: Not on file  . Highest education level: Not on file  Occupational History  . Not on file  Social Needs  . Financial resource strain: Not on file  . Food insecurity:    Worry: Not on  file    Inability: Not on file  . Transportation needs:    Medical: Not on file    Non-medical: Not on file  Tobacco Use  . Smoking status: Never Smoker  . Smokeless tobacco: Never Used  Substance and Sexual Activity  . Alcohol use: Yes    Alcohol/week: 0.0 oz    Comment: occasional  . Drug use: No  . Sexual activity: Yes    Birth control/protection: Pill  Lifestyle  . Physical activity:    Days per week: Not on file    Minutes per session: Not on file  . Stress: Not on file  Relationships  . Social connections:    Talks on phone: Not on file    Gets together: Not on file    Attends religious service: Not on file    Active member of club or organization: Not on file    Attends meetings of clubs or organizations: Not on file    Relationship status: Not on file  . Intimate partner violence:    Fear of current or ex partner: Not on file    Emotionally abused: Not on file    Physically abused: Not on file    Forced sexual activity: Not on file  Other Topics Concern  . Not on file  Social History Narrative   Parents who are both [place] RN's   Works at [employer]   Works as a Chartered certified accountant at [employer]  Past Surgical History:  Procedure Laterality Date  . LAPAROSCOPY  12/19/10  . MYRINGOTOMY     x2  . NASAL SINUS SURGERY    . TONSILLECTOMY    . TYMPANOPLASTY  2000  . TYMPANOSTOMY    . UPPER GASTROINTESTINAL ENDOSCOPY  10/17/2010   normal    Family History  Problem Relation Age of Onset  . Diabetes Mother   . Hypertension Mother   . Anxiety disorder Mother   . Irritable bowel syndrome Father   . Depression Father   . Anxiety disorder Father   . Breast cancer Maternal Grandmother   . Heart disease Maternal Grandfather   . Brain cancer Paternal Grandmother        tumor  . Colon cancer Neg Hx     Allergies  Allergen Reactions  . Cymbalta [Duloxetine Hcl] Nausea Only    Current Outpatient Medications on File Prior to Visit  Medication Sig  Dispense Refill  . citalopram (CELEXA) 40 MG tablet Take 1 tablet (40 mg total) by mouth daily. 90 tablet 1  . dicyclomine (BENTYL) 10 MG capsule Take 1 capsule (10 mg total) by mouth 3 (three) times daily as needed for spasms. 15 capsule 0  . levonorgestrel-ethinyl estradiol (AVIANE,ALESSE,LESSINA) 0.1-20 MG-MCG tablet Take 1 tablet by mouth daily.  0  . LINZESS 72 MCG capsule TAKE ONE CAPSULE BY MOUTH DAILY BEFORE BREAKFAST 30 capsule 5  . ondansetron (ZOFRAN ODT) 4 MG disintegrating tablet Take 1 tablet (4 mg total) by mouth every 6 (six) hours as needed for nausea or vomiting. 10 tablet 0  . pantoprazole (PROTONIX) 40 MG tablet Take 1 tablet (40 mg total) by mouth daily. 30 tablet 3  . valACYclovir (VALTREX) 1000 MG tablet Take 1 tablet (1,000 mg total) by mouth daily. 30 tablet 0   No current facility-administered medications on file prior to visit.     BP 111/79 (BP Location: Right Arm, Patient Position: Sitting, Cuff Size: Small)   Pulse 71   Temp 98.5 F (36.9 C) (Oral)   Resp 16   Ht 5\' 6"  (1.676 m)   Wt 123 lb 6.4 oz (56 kg)   LMP 02/06/2018   SpO2 100%   BMI 19.92 kg/m       Objective:   Physical Exam Physical Exam  Constitutional: She is oriented to person, place, and time. She appears well-developed and well-nourished. No distress.  HENT:  Head: Normocephalic and atraumatic.  Right Ear: Tympanic membrane and ear canal normal.  Left Ear: Tympanic membrane and ear canal normal.  Mouth/Throat: Oropharynx is clear and moist.  Eyes: Pupils are equal, round, and reactive to light. No scleral icterus.  Neck: Normal range of motion. No thyromegaly present.  Cardiovascular: Normal rate and regular rhythm.   No murmur heard. Pulmonary/Chest: Effort normal and breath sounds normal. No respiratory distress. He has no wheezes. She has no rales. She exhibits no tenderness.  Abdominal: Soft. Bowel sounds are normal. She exhibits no distension and no mass. There is no tenderness.  There is no rebound and no guarding.  Musculoskeletal: She exhibits no edema.  Lymphadenopathy:    She has no cervical adenopathy.  Neurological: She is alert and oriented to person, place, and time. She has normal patellar reflexes. She exhibits normal muscle tone. Coordination normal.  Skin: Skin is warm and dry.  Psychiatric: She has a normal mood and affect. Her behavior is normal. Judgment and thought content normal.  Breasts: Examined lying Right: Without masses, retractions, discharge or  axillary adenopathy.  Left: Without masses, retractions, discharge or axillary adenopathy.  Pelvis: deferred         Assessment & Plan:    Preventative care- discussed healthy diet, exercise. Will obtain routine lab work and also needs varicella titer and Hep B titer and TB gold test for school.       Assessment & Plan:

## 2018-03-05 ENCOUNTER — Telehealth: Payer: Self-pay | Admitting: Family

## 2018-03-05 DIAGNOSIS — Z0184 Encounter for antibody response examination: Secondary | ICD-10-CM

## 2018-03-05 DIAGNOSIS — Z789 Other specified health status: Principal | ICD-10-CM

## 2018-03-05 LAB — HEPATITIS B SURFACE ANTIBODY, QUANTITATIVE: Hepatitis B-Post: 9 m[IU]/mL — ABNORMAL LOW (ref >=10)

## 2018-03-05 LAB — VARICELLA ZOSTER ANTIBODY, IGG: Varicella IgG: 394.4 index

## 2018-03-05 NOTE — Telephone Encounter (Addendum)
Opened in error

## 2018-03-05 NOTE — Telephone Encounter (Signed)
Varicella titer is positive but Hep B is negative. Recommend Hep B booster vaccine and repeat hep B titer in 1 month. Order placed.

## 2018-03-06 LAB — QUANTIFERON-TB GOLD PLUS
Mitogen-NIL: >10 IU/mL
NIL: 0.04 IU/mL
QuantiFERON-TB Gold Plus: NEGATIVE
TB1-NIL: <0 IU/mL
TB2-NIL: <0 IU/mL

## 2018-03-08 MED FILL — LEVONOR-ETH ESTRAD 0.1-0.02: 0.1-20 | 84 days supply | Qty: 84 | Fill #3

## 2018-03-12 NOTE — Telephone Encounter (Signed)
Please contact pt re: recommendations below.

## 2018-03-12 NOTE — Telephone Encounter (Signed)
Sent patient mychart message

## 2018-03-22 ENCOUNTER — Ambulatory Visit: Payer: 59 | Admitting: Family

## 2018-04-05 ENCOUNTER — Ambulatory Visit: Payer: 59 | Admitting: Family

## 2018-04-05 DIAGNOSIS — Z23 Encounter for immunization: Secondary | ICD-10-CM | POA: Diagnosis not present

## 2018-04-05 NOTE — Progress Notes (Signed)
Patient seen by CME for hep B booster only.  No office visit charge.

## 2018-05-03 ENCOUNTER — Other Ambulatory Visit (INDEPENDENT_AMBULATORY_CARE_PROVIDER_SITE_OTHER): Payer: 59

## 2018-05-03 DIAGNOSIS — Z0000 Encounter for general adult medical examination without abnormal findings: Secondary | ICD-10-CM | POA: Diagnosis not present

## 2018-05-03 DIAGNOSIS — Z0184 Encounter for antibody response examination: Secondary | ICD-10-CM

## 2018-05-03 DIAGNOSIS — Z789 Other specified health status: Secondary | ICD-10-CM

## 2018-05-04 LAB — HEPATITIS B SURFACE ANTIBODY, QUANTITATIVE: Hepatitis B-Post: >1000 m[IU]/mL (ref >=10)

## 2018-05-08 DIAGNOSIS — Z6820 Body mass index (BMI) 20.0-20.9, adult: Secondary | ICD-10-CM | POA: Diagnosis not present

## 2018-05-08 DIAGNOSIS — Z01419 Encounter for gynecological examination (general) (routine) without abnormal findings: Secondary | ICD-10-CM | POA: Diagnosis not present

## 2018-05-27 ENCOUNTER — Other Ambulatory Visit: Payer: Self-pay | Admitting: Family

## 2018-05-27 MED FILL — LEVONOR-ETH ESTRAD 0.1-0.02: 0.1-20 | 84 days supply | Qty: 84 | Fill #0

## 2018-05-27 MED FILL — LINZESS 72 MCG CAPSULE: 72 | 90 days supply | Qty: 90 | Fill #0

## 2018-06-11 ENCOUNTER — Telehealth: Payer: Self-pay | Admitting: Family

## 2018-06-11 DIAGNOSIS — K5900 Constipation, unspecified: Secondary | ICD-10-CM | POA: Diagnosis not present

## 2018-06-11 DIAGNOSIS — Z3200 Encounter for pregnancy test, result unknown: Secondary | ICD-10-CM | POA: Diagnosis not present

## 2018-06-11 DIAGNOSIS — N83201 Unspecified ovarian cyst, right side: Secondary | ICD-10-CM | POA: Diagnosis not present

## 2018-06-11 DIAGNOSIS — R1031 Right lower quadrant pain: Secondary | ICD-10-CM | POA: Diagnosis not present

## 2018-06-11 MED FILL — traMADol HCL 50 MG TABS: 50 | 5 days supply | Qty: 10 | Fill #0

## 2018-06-11 NOTE — Telephone Encounter (Signed)
Copied from [place] [id number]. Topic: Quick Communication - See Telephone Encounter >> Jun 11, 2018  4:29 PM Rosalin Hawking wrote: CRM for notification. See Telephone encounter for: 06/11/18.   Pt states saw OBGYN today and that was recommended by her doctor to get a higher dosage of LINZESS 72 MCG capsule, pt states is needing it for this wk since doctor saw other things that is needed to be taken care of with a hight dosage of Linzess 240 dosage (pt was not sure of the amount but did say a higher dosage since provider saw her ultrasound) If any question please call pt at [phone]. Medication can be sent to [facility]. Please advise.

## 2018-06-11 NOTE — Telephone Encounter (Signed)
Let's bring her back in to discuss her IBS and further evaluate and we can increase at that time if necessary.

## 2018-06-12 NOTE — Telephone Encounter (Signed)
Attempted to notify pt and left detailed message on voicemail that I would place her on PCP's schedule at 10am on Friday as that is the only opening PCP has at this time. Advised pt to call if she is unable to make appt on Friday.

## 2018-06-14 ENCOUNTER — Ambulatory Visit: Payer: 59 | Admitting: Family

## 2018-06-17 ENCOUNTER — Ambulatory Visit (HOSPITAL_BASED_OUTPATIENT_CLINIC_OR_DEPARTMENT_OTHER)
Admission: RE | Admit: 2018-06-17 | Discharge: 2018-06-17 | Disposition: A | Discharge status: Home / Self Care | Payer: 59 | Source: Ambulatory Visit | Attending: Family | Admitting: Family

## 2018-06-17 ENCOUNTER — Ambulatory Visit: Payer: 59 | Admitting: Family

## 2018-06-17 ENCOUNTER — Encounter: Payer: Self-pay | Admitting: Family

## 2018-06-17 VITALS — BP 109/72 | HR 65 | Temp 98.8°F | Resp 16 | Ht 66.0 in | Wt 126.0 lb

## 2018-06-17 DIAGNOSIS — R140 Abdominal distension (gaseous): Secondary | ICD-10-CM | POA: Diagnosis not present

## 2018-06-17 DIAGNOSIS — K5900 Constipation, unspecified: Secondary | ICD-10-CM

## 2018-06-17 DIAGNOSIS — K589 Irritable bowel syndrome without diarrhea: Secondary | ICD-10-CM

## 2018-06-17 LAB — POCT URINE PREGNANCY: Preg Test, Ur: NEGATIVE

## 2018-06-17 NOTE — Progress Notes (Signed)
Patient ID: Crystal [name], female   DOB: May 23, 1991, 27 y.o.   MRN: [id number]

## 2018-06-17 NOTE — Patient Instructions (Signed)
Please complete x-ray on the first floor.  Work on a diet high in produce.

## 2018-06-17 NOTE — Progress Notes (Signed)
Subjective:    Patient ID: Jabier Mutton, female    DOB: Nov 01, 1990, 27 y.o.   MRN: [id number]  HPI   Reports that she does not go if she takes linzess. Reports that she goes once a day in the AM.  Has "just liquid" stools.  Has not had a formed stool in months.  Reports that she has had some right lower quadrant pain. Saw OB and they told that she has a right sided ovarian cyst.  Reports appetite is OK. Has had nausea and not wanting ot eat.  Feels like fiber makes her so bloated and gassey.  Reports that she did dulcolax, miralax x 3 days.      Review of Systems See HPI  Past Medical History:  Diagnosis Date  . Anal fissure 10/09/2011   Symptoms began in February 2013. Diagnosed by rectal and anoscopic examination 10/09/2011. Diltiazem gel begun.   . Benign neoplasm of eyelid including canthus   . Dyspepsia    functional  . GERD (gastroesophageal reflux disease)   . Herpes simplex type 1 antibody positive   . Internal hemorrhoids 10/09/2011  . Irritable bowel syndrome   . Ovarian cyst   . UTI (lower urinary tract infection)   . Yeast infection      Social History   Socioeconomic History  . Marital status: Married    Spouse name: Not on file  . Number of children: Not on file  . Years of education: Not on file  . Highest education level: Not on file  Occupational History  . Not on file  Social Needs  . Financial resource strain: Not on file  . Food insecurity:    Worry: Not on file    Inability: Not on file  . Transportation needs:    Medical: Not on file    Non-medical: Not on file  Tobacco Use  . Smoking status: Never Smoker  . Smokeless tobacco: Never Used  Substance and Sexual Activity  . Alcohol use: Yes    Alcohol/week: 0.0 standard drinks    Comment: occasional  . Drug use: No  . Sexual activity: Yes    Birth control/protection: Pill  Lifestyle  . Physical activity:    Days per week: Not on file    Minutes per session: Not on file  . Stress:  Not on file  Relationships  . Social connections:    Talks on phone: Not on file    Gets together: Not on file    Attends religious service: Not on file    Active member of club or organization: Not on file    Attends meetings of clubs or organizations: Not on file    Relationship status: Not on file  . Intimate partner violence:    Fear of current or ex partner: Not on file    Emotionally abused: Not on file    Physically abused: Not on file    Forced sexual activity: Not on file  Other Topics Concern  . Not on file  Social History Narrative   Parents who are both [place] RN's   Works at [employer]   Works as a Chartered certified accountant at [employer]       Past Surgical History:  Procedure Laterality Date  . LAPAROSCOPY  12/19/10  . MYRINGOTOMY     x2  . NASAL SINUS SURGERY    . TONSILLECTOMY    . TYMPANOPLASTY  2000  . TYMPANOSTOMY    . UPPER  GASTROINTESTINAL ENDOSCOPY  10/17/2010   normal    Family History  Problem Relation Age of Onset  . Diabetes Mother   . Hypertension Mother   . Anxiety disorder Mother   . Irritable bowel syndrome Father   . Depression Father   . Anxiety disorder Father   . Breast cancer Maternal Grandmother   . Heart disease Maternal Grandfather   . Brain cancer Paternal Grandmother        tumor  . Colon cancer Neg Hx     Allergies  Allergen Reactions  . Cymbalta [Duloxetine Hcl] Nausea Only    Current Outpatient Medications on File Prior to Visit  Medication Sig Dispense Refill  . citalopram (CELEXA) 40 MG tablet Take 1 tablet (40 mg total) by mouth daily. 90 tablet 1  . dicyclomine (BENTYL) 10 MG capsule Take 1 capsule (10 mg total) by mouth 3 (three) times daily as needed for spasms. 15 capsule 0  . levonorgestrel-ethinyl estradiol (AVIANE,ALESSE,LESSINA) 0.1-20 MG-MCG tablet Take 1 tablet by mouth daily.  0  . LINZESS 72 MCG capsule TAKE ONE CAPSULE BY MOUTH DAILY BEFORE BREAKFAST 30 capsule 5  . ondansetron (ZOFRAN ODT) 4 MG  disintegrating tablet Take 1 tablet (4 mg total) by mouth every 6 (six) hours as needed for nausea or vomiting. 10 tablet 0  . pantoprazole (PROTONIX) 40 MG tablet Take 1 tablet (40 mg total) by mouth daily. 30 tablet 3  . valACYclovir (VALTREX) 1000 MG tablet Take 1 tablet (1,000 mg total) by mouth daily. 30 tablet 0   No current facility-administered medications on file prior to visit.     BP 109/72 (BP Location: Right Arm, Patient Position: Sitting, Cuff Size: Small)   Pulse 65   Temp 98.8 F (37.1 C) (Oral)   Resp 16   Ht 5\' 6"  (1.676 m)   Wt 126 lb (57.2 kg)   LMP 05/27/2018   SpO2 100%   BMI 20.34 kg/m       Objective:   Physical Exam  Constitutional: She appears well-developed and well-nourished.  Cardiovascular: Normal rate, regular rhythm and normal heart sounds.  No murmur heard. Pulmonary/Chest: Effort normal and breath sounds normal. No respiratory distress. She has no wheezes.  Psychiatric: She has a normal mood and affect. Her behavior is normal. Judgment and thought content normal.          Assessment & Plan:  IBS- uncontrolled. Check KUB to look at stool burden, continue linzess. Advised pt on high fiber diet and suggested that she schedule follow up with her GI specialist.

## 2018-06-18 ENCOUNTER — Encounter: Payer: Self-pay | Admitting: Family

## 2018-08-09 ENCOUNTER — Encounter: Payer: Self-pay | Admitting: Family

## 2018-08-09 MED ORDER — LUBIPROSTONE 8 MCG PO CAPS: 8.0000 ug | ORAL_CAPSULE | Freq: Two times a day (BID) | ORAL | 5 refills | Status: DC

## 2018-08-12 MED FILL — LEVONOR-ETH ESTRAD 0.1-0.02: 0.1-20 | 84 days supply | Qty: 84 | Fill #1

## 2018-10-03 ENCOUNTER — Encounter: Payer: Self-pay | Admitting: Family

## 2018-10-04 MED ORDER — CITALOPRAM HYDROBROMIDE 40 MG PO TABS: 40.0000 mg | ORAL_TABLET | Freq: Every day | ORAL | 1 refills | Status: AC

## 2018-10-04 MED FILL — CITALOPRAM HBR 40 MG TABLET: 40 | 90 days supply | Qty: 90 | Fill #0

## 2018-11-12 MED FILL — LEVONOR-ETH ESTRAD 0.1-0.02: 0.1-20 | 84 days supply | Qty: 84 | Fill #2

## 2018-11-28 ENCOUNTER — Other Ambulatory Visit: Payer: Self-pay | Admitting: Family

## 2018-11-29 MED ORDER — VALACYCLOVIR HCL 1 G PO TABS: 1000.0000 mg | ORAL_TABLET | Freq: Every day | ORAL | 0 refills | Status: AC

## 2018-11-29 MED FILL — valACYclovir HCL 1 GM TABS: 1 | 30 days supply | Qty: 30 | Fill #0

## 2019-01-29 ENCOUNTER — Telehealth: Payer: Self-pay | Admitting: Family

## 2019-01-29 NOTE — Telephone Encounter (Signed)
Called PT per CMA Patient should go to ER. 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  I called pt back was able to get an appt for 01/30/19. But informed pat if she begins to feel worst to go to ER

## 2019-01-30 ENCOUNTER — Encounter: Payer: Self-pay | Admitting: Internal Medicine

## 2019-01-30 ENCOUNTER — Ambulatory Visit (INDEPENDENT_AMBULATORY_CARE_PROVIDER_SITE_OTHER): Payer: 59 | Admitting: Internal Medicine

## 2019-01-30 ENCOUNTER — Other Ambulatory Visit: Payer: Self-pay

## 2019-01-30 VITALS — BP 118/73 | HR 92 | Temp 98.2°F | Resp 16 | Ht 66.0 in | Wt 120.5 lb

## 2019-01-30 DIAGNOSIS — R059 Cough, unspecified: Secondary | ICD-10-CM

## 2019-01-30 DIAGNOSIS — F419 Anxiety disorder, unspecified: Secondary | ICD-10-CM

## 2019-01-30 DIAGNOSIS — F329 Major depressive disorder, single episode, unspecified: Secondary | ICD-10-CM | POA: Diagnosis not present

## 2019-01-30 DIAGNOSIS — R05 Cough: Secondary | ICD-10-CM | POA: Diagnosis not present

## 2019-01-30 DIAGNOSIS — F32A Depression, unspecified: Secondary | ICD-10-CM

## 2019-01-30 MED ORDER — METHOCARBAMOL 500 MG PO TABS: 500.0000 mg | ORAL_TABLET | Freq: Three times a day (TID) | ORAL | 0 refills | Status: AC | PRN

## 2019-01-30 MED FILL — METHOCARBAMOL 500 MG TABLET: 500 | 15 days supply | Qty: 45 | Fill #0

## 2019-01-30 NOTE — Patient Instructions (Signed)
Please see your PCP in 2 weeks
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
## 2019-01-30 NOTE — Progress Notes (Signed)
Pre visit review using our clinic review tool, if applicable. No additional management support is needed unless otherwise documented below in the visit note. 

## 2019-01-31 MED FILL — AMOX-CLAV 875-125 MG TABLET: 875-125 | 7 days supply | Qty: 14 | Fill #0

## 2019-02-07 MED FILL — LEVONOR-ETH ESTRAD 0.1-0.02: 0.1-20 | 84 days supply | Qty: 84 | Fill #3

## 2019-02-11 ENCOUNTER — Encounter: Payer: Self-pay | Admitting: Family

## 2019-02-11 ENCOUNTER — Ambulatory Visit (INDEPENDENT_AMBULATORY_CARE_PROVIDER_SITE_OTHER): Payer: 59 | Admitting: Family

## 2019-02-11 ENCOUNTER — Other Ambulatory Visit: Payer: Self-pay

## 2019-02-11 VITALS — BP 107/69 | HR 63 | Temp 98.3°F | Resp 16 | Ht 66.0 in | Wt 121.0 lb

## 2019-02-11 DIAGNOSIS — F419 Anxiety disorder, unspecified: Secondary | ICD-10-CM

## 2019-02-11 MED ORDER — ALPRAZOLAM 0.5 MG PO TABS: ORAL_TABLET | ORAL | 0 refills | Status: DC

## 2019-02-11 MED FILL — ALPRAZOLAM 0.5 MG TABS: 0.5 | 15 days supply | Qty: 30 | Fill #0

## 2019-02-11 NOTE — Patient Instructions (Signed)
Please keep your upcoming appointment for counseling. You may use xanax 1/2 to 1 tablet by mouth twice daily as needed for anxiety.
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
## 2019-02-12 LAB — PAIN MGMT, PROFILE 8 W/CONF, U
6 Acetylmorphine: NEGATIVE ng/mL
Alcohol Metabolites: NEGATIVE ng/mL (ref <500)
Amphetamines: NEGATIVE ng/mL
Benzodiazepines: NEGATIVE ng/mL
Buprenorphine, Urine: NEGATIVE ng/mL
Cocaine Metabolite: NEGATIVE ng/mL
Creatinine: 162.4 mg/dL
MDMA: NEGATIVE ng/mL
Marijuana Metabolite: NEGATIVE ng/mL
Opiates: NEGATIVE ng/mL
Oxidant: NEGATIVE ug/mL
Oxycodone: NEGATIVE ng/mL
pH: 5.5 (ref 4.5–9.0)

## 2019-03-04 ENCOUNTER — Encounter: Payer: Self-pay | Admitting: Family

## 2019-03-04 ENCOUNTER — Other Ambulatory Visit: Payer: Self-pay

## 2019-03-04 ENCOUNTER — Ambulatory Visit (INDEPENDENT_AMBULATORY_CARE_PROVIDER_SITE_OTHER): Payer: 59 | Admitting: Family

## 2019-03-04 VITALS — BP 110/70 | HR 64 | Temp 98.0°F | Resp 16 | Wt 121.4 lb

## 2019-03-04 DIAGNOSIS — F419 Anxiety disorder, unspecified: Secondary | ICD-10-CM
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
## 2019-04-01 ENCOUNTER — Emergency Department (INDEPENDENT_AMBULATORY_CARE_PROVIDER_SITE_OTHER): Payer: 59

## 2019-04-01 ENCOUNTER — Emergency Department (INDEPENDENT_AMBULATORY_CARE_PROVIDER_SITE_OTHER)
Admission: EM | Admit: 2019-04-01 | Discharge: 2019-04-01 | Disposition: A | Discharge status: Home / Self Care | Payer: 59 | Source: Home / Self Care

## 2019-04-01 ENCOUNTER — Other Ambulatory Visit: Payer: Self-pay

## 2019-04-01 DIAGNOSIS — R059 Cough, unspecified: Secondary | ICD-10-CM

## 2019-04-01 DIAGNOSIS — J189 Pneumonia, unspecified organism: Secondary | ICD-10-CM

## 2019-04-01 DIAGNOSIS — R05 Cough: Secondary | ICD-10-CM

## 2019-04-01 DIAGNOSIS — J181 Lobar pneumonia, unspecified organism: Secondary | ICD-10-CM | POA: Diagnosis not present

## 2019-04-01 DIAGNOSIS — R509 Fever, unspecified: Secondary | ICD-10-CM

## 2019-04-01 DIAGNOSIS — R918 Other nonspecific abnormal finding of lung field: Secondary | ICD-10-CM

## 2019-04-01 MED ORDER — AZITHROMYCIN 250 MG PO TABS: 250.0000 mg | ORAL_TABLET | Freq: Every day | ORAL | 0 refills | Status: AC

## 2019-04-01 MED ORDER — BENZONATATE 100 MG PO CAPS: 100.0000 mg | ORAL_CAPSULE | Freq: Three times a day (TID) | ORAL | 0 refills | Status: AC

## 2019-04-01 MED ORDER — ALBUTEROL SULFATE HFA 108 (90 BASE) MCG/ACT IN AERS: 1.0000 | INHALATION_SPRAY | Freq: Four times a day (QID) | RESPIRATORY_TRACT | 0 refills | Status: AC | PRN

## 2019-04-01 MED ORDER — METHYLPREDNISOLONE SODIUM SUCC 40 MG IJ SOLR
80.0000 mg | Freq: Once | INTRAMUSCULAR | Status: AC
  Administered 2019-04-01: 80 mg via INTRAMUSCULAR

## 2019-04-01 MED ORDER — PREDNISONE 50 MG PO TABS: 50.0000 mg | ORAL_TABLET | Freq: Every day | ORAL | 0 refills | Status: AC

## 2019-04-01 MED FILL — predniSONE 50 MG TABS: 50 | 5 days supply | Qty: 5 | Fill #0

## 2019-04-01 MED FILL — ALBUTEROL SULFATE HFA 108 (: 108 (90 BAS | 25 days supply | Qty: 18 | Fill #0

## 2019-04-01 MED FILL — BENZONATATE 100 MG CAPS: 100 | 4 days supply | Qty: 21 | Fill #0

## 2019-04-01 MED FILL — AZITHROMYCIN 250 MG TABLET: 250 | 5 days supply | Qty: 6 | Fill #0

## 2019-04-01 NOTE — Discharge Instructions (Signed)
°  Please take antibiotics as prescribed and be sure to complete entire course even if you start to feel better to ensure infection does not come back.  You may take 500mg  acetaminophen every 4-6 hours or in combination with ibuprofen 400-600mg  every 6-8 hours as needed for pain, inflammation, and fever.  Be sure to well hydrated with clear liquids and get at least 8 hours of sleep at night, preferably more while sick.   Please follow up with family medicine in 1 week if needed.  Due to concern for possibly having Covid-19, it is advised that you self-isolate at home until test results come back.  If positive, it is recommended you stay isolated for at least 10 days after symptom onset and 24 hours after last fever without taking medication (whichever is longer).  If you MUST go out, please wear a mask at all times, limit contact with others.

## 2019-04-01 NOTE — ED Triage Notes (Signed)
5 days of cough, thick mucous, unable to cough up.  Pain in the left chest.   Mucinex cold and flu...nothing is helping

## 2019-04-01 NOTE — ED Provider Notes (Signed)
[facility]    CSN: [id number] Arrival date & time: 04/01/19  1522      History   Chief Complaint Chief Complaint  Patient presents with  . Cough    HPI Crystal [name] is a 28 y.o. female.   HPI Crystal [name] is a 28 y.o. female presenting to UC with c/o 5 days of worsening cough, congestion, chest tightness, and Left side chest pain.  She has taken Mucinex cold and flu w/o relief.  Cough is productive with thick mucous that is hard to get up.  Low grade fever the other day. Denies n/v/d. 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 She does wear a mask at work.  No specific known exposure to Covid-19. No sick contacts outside of work. No travel.    Past Medical History:  Diagnosis Date  . Anal fissure 10/09/2011   Symptoms began in February 2013. Diagnosed by rectal and anoscopic examination 10/09/2011. Diltiazem gel begun.   . Benign neoplasm of eyelid including canthus   . Dyspepsia    functional  . GERD (gastroesophageal reflux disease)   . Herpes simplex type 1 antibody positive   . Internal hemorrhoids 10/09/2011  . Irritable bowel syndrome   . Ovarian cyst   . UTI (lower urinary tract infection)   . Yeast infection     Patient Active Problem List   Diagnosis Date Noted  . Right knee pain 11/19/2015  . Bleeding internal hemorrhoids 11/27/2014  . ADD (attention deficit disorder) 10/01/2014  . Generalized abdominal pain 07/09/2014  . Axillary lymphadenopathy 05/18/2014  . Anxiety and depression 04/14/2014  . Fatigue 04/14/2014  . Routine general medical examination at a health care facility 03/25/2013  . IBS (irritable bowel syndrome)  02/25/2013  . 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 Functional dyspepsia 08/29/2010    Past Surgical History:  Procedure Laterality Date  . LAPAROSCOPY  12/19/10  . MYRINGOTOMY     x2  . NASAL SINUS SURGERY    . TONSILLECTOMY    . TYMPANOPLASTY  2000  . TYMPANOSTOMY    . UPPER  GASTROINTESTINAL ENDOSCOPY  10/17/2010   normal    OB History   No obstetric history on file.      Home Medications    Prior to Admission medications   Medication Sig Start Date End Date Taking? Authorizing Provider  albuterol (VENTOLIN HFA) 108 (90 Base) MCG/ACT inhaler Inhale 1-2 puffs into the lungs every 6 (six) hours as needed for wheezing or shortness of breath. 04/01/19   Noe Gens, PA-C  ALPRAZolam Duanne Moron) 0.5 MG tablet 1/2 to 1 tablet by mouth twice daily as needed 02/11/19   Debbrah Alar, NP  azithromycin (ZITHROMAX) 250 MG tablet Take 1 tablet (250 mg total) by mouth daily. Take first 2 tablets together, then 1 every day until finished. 04/01/19   Noe Gens, PA-C  benzonatate (TESSALON) 100 MG capsule Take 1-2 capsules (100-200 mg total) by mouth every 8 (eight) hours. 04/01/19   Noe Gens, PA-C  citalopram (CELEXA) 40 MG tablet Take 1 tablet (40 mg total) by mouth daily. 10/04/18   Debbrah Alar, NP  levonorgestrel-ethinyl estradiol (AVIANE,ALESSE,LESSINA) 0.1-20 MG-MCG tablet Take 1 tablet by mouth daily. 04/09/17   [provider]  methocarbamol (ROBAXIN) 500 MG tablet Take 1 tablet (500 mg total) by mouth every 8 (eight) hours as needed for muscle spasms. 01/30/19   Colon Branch, MD  pantoprazole (PROTONIX) 40 MG tablet Take  1 tablet (40 mg total) by mouth daily. 07/18/16   Debbrah Alar, NP  predniSONE (DELTASONE) 50 MG tablet Take 1 tablet (50 mg total) by mouth daily with breakfast for 5 days. 04/01/19 04/06/19  Noe Gens, PA-C  valACYclovir (VALTREX) 1000 MG tablet Take 1 tablet (1,000 mg total) by mouth daily. 11/29/18   Debbrah Alar, NP    Family History Family History  Problem Relation Age of Onset  . Diabetes Mother   . Hypertension Mother   . Anxiety disorder Mother   . Irritable bowel syndrome Father   . Depression Father   . Anxiety disorder Father   . Breast cancer Maternal Grandmother   . Heart disease Maternal  Grandfather   . Brain cancer Paternal Grandmother        tumor  . Colon cancer Neg Hx     Social History Social History   Tobacco Use  . Smoking status: Never Smoker  . Smokeless tobacco: Never Used  Substance Use Topics  . Alcohol use: Yes    Alcohol/week: 0.0 standard drinks    Comment: occasional  . Drug use: No     Allergies   Cymbalta [duloxetine hcl]   Review of Systems Review of Systems  Constitutional: Positive for fatigue and fever (low grade). Negative for chills.  HENT: Positive for congestion and sore throat (from coughing). Negative for ear pain, trouble swallowing and voice change.   Respiratory: Positive for cough, chest tightness and shortness of breath.   Cardiovascular: Negative for chest pain and palpitations.  Gastrointestinal: Negative for abdominal pain, diarrhea, nausea and vomiting.  Musculoskeletal: Negative for arthralgias, back pain and myalgias.  Skin: Negative for rash.     Physical Exam Triage Vital Signs ED Triage Vitals  Enc Vitals Group     BP 04/01/19 1605 124/83     Pulse Rate 04/01/19 1605 (!) 103     Resp 04/01/19 1605 20     Temp 04/01/19 1605 98.8 F (37.1 C)     Temp Source 04/01/19 1605 Oral     SpO2 04/01/19 1605 98 %     Weight 04/01/19 1606 122 lb (55.3 kg)     Height 04/01/19 1606 5\' 6"  (1.676 m)     Head Circumference --      Peak Flow --      Pain Score 04/01/19 1606 6     Pain Loc --      Pain Edu? --      Excl. in [place]? --    No data found.  Updated Vital Signs BP 124/83 (BP Location: Right Arm)   Pulse (!) 103   Temp 98.8 F (37.1 C) (Oral)   Resp 20   Ht 5\' 6"  (1.676 m)   Wt 122 lb (55.3 kg)   LMP 04/01/2019   SpO2 98%   BMI 19.69 kg/m     Physical Exam Vitals signs and nursing note reviewed.  Constitutional:      Appearance: Normal appearance. She is well-developed.  HENT:     Head: Normocephalic and atraumatic.     Right Ear: Tympanic membrane normal.     Left Ear: Tympanic membrane  normal.     Nose: Nose normal.     Right Sinus: No maxillary sinus tenderness or frontal sinus tenderness.     Left Sinus: No maxillary sinus tenderness or frontal sinus tenderness.     Mouth/Throat:     Lips: Pink.     Mouth: Mucous membranes are moist.  Pharynx: Oropharynx is clear. Uvula midline.  Neck:     Musculoskeletal: Normal range of motion.  Cardiovascular:     Rate and Rhythm: Regular rhythm. Tachycardia present.     Comments: Mild tachycardia  Pulmonary:     Effort: Pulmonary effort is normal. No respiratory distress.     Breath sounds: Normal breath sounds. No stridor. No wheezing, rhonchi or rales.  Abdominal:     Palpations: Abdomen is soft.     Tenderness: There is no abdominal tenderness.  Musculoskeletal: Normal range of motion.  Skin:    General: Skin is warm and dry.     Capillary Refill: Capillary refill takes less than 2 seconds.     Findings: No rash.  Neurological:     Mental Status: She is alert and oriented to person, place, and time.  Psychiatric:        Behavior: Behavior normal.      UC Treatments / Results  Labs (all labs ordered are listed, but only abnormal results are displayed) Labs Reviewed  NOVEL CORONAVIRUS, NAA    EKG   Radiology Dg Chest 2 View  Result Date: 04/01/2019 CLINICAL DATA:  Pt with low grade fever, cough and congestion for 5 days. Works in the [employer] around [employer] patients.no testing yet. EXAM: CHEST - 2 VIEW COMPARISON:  Chest radiographs 01/02/2016, 09/03/2015 FINDINGS: The heart size and mediastinal contours are within normal limits. There is a subtle new opacity in the left mid lung. The right lung is clear. No pneumothorax or pleural effusion. The visualized skeletal structures are unremarkable. IMPRESSION: Subtle new opacity in the left mid lung, could represent developing infection. Electronically Signed   By: Audie Pinto M.D.   On: 04/01/2019 17:01    Procedures Procedures (including critical care time)   Medications Ordered in UC Medications  methylPREDNISolone sodium succinate (SOLU-MEDROL) 40 mg/mL injection 80 mg (80 mg Intramuscular Given 04/01/19 1714)    Initial Impression / Assessment and Plan / UC Course  I have reviewed the triage vital signs and the nursing notes.  Pertinent labs & imaging results that were available during my care of the patient were reviewed by me and considered in my medical decision making (see chart for details).     Discussed imaging with pt Will tx for pneumonia Pt has done well with steroid treatments for her breathing in the past. Covid-19 tset pending.  AVS provided Work note provided, may be extended if Covid test is positive.  Final Clinical Impressions(s) / UC Diagnoses   Final diagnoses:  Cough  Community acquired pneumonia of left lower lobe of lung ([place])     Discharge Instructions      Please take antibiotics as prescribed and be sure to complete entire course even if you start to feel better to ensure infection does not come back.  You may take 500mg  acetaminophen every 4-6 hours or in combination with ibuprofen 400-600mg  every 6-8 hours as needed for pain, inflammation, and fever.  Be sure to well hydrated with clear liquids and get at least 8 hours of sleep at night, preferably more while sick.   Please follow up with family medicine in 1 week if needed.  Due to concern for possibly having Covid-19, it is advised that you self-isolate at home until test results come back.  If positive, it is recommended you stay isolated for at least 10 days after symptom onset and 24 hours after last fever without taking medication (whichever is longer).  If you MUST  go out, please wear a mask at all times, limit contact with others.      ED Prescriptions    Medication Sig Dispense Auth. Provider   predniSONE (DELTASONE) 50 MG tablet Take 1 tablet (50 mg total) by mouth daily with breakfast for 5 days. 5 tablet Gerarda Fraction, Owenn Rothermel O, PA-C    azithromycin (ZITHROMAX) 250 MG tablet Take 1 tablet (250 mg total) by mouth daily. Take first 2 tablets together, then 1 every day until finished. 6 tablet Gerarda Fraction, Marylee Belzer O, PA-C   benzonatate (TESSALON) 100 MG capsule Take 1-2 capsules (100-200 mg total) by mouth every 8 (eight) hours. 21 capsule Gerarda Fraction, Tilton Marsalis O, PA-C   albuterol (VENTOLIN HFA) 108 (90 Base) MCG/ACT inhaler Inhale 1-2 puffs into the lungs every 6 (six) hours as needed for wheezing or shortness of breath. 18 g Noe Gens, PA-C     Controlled Substance Prescriptions [place] Controlled Substance Registry consulted? Not Applicable   Tyrell Antonio 04/01/19 1718

## 2019-04-02 LAB — NOVEL CORONAVIRUS, NAA: SARS-CoV-2, NAA: NOT DETECTED

## 2019-04-22 ENCOUNTER — Other Ambulatory Visit: Payer: Self-pay | Admitting: Family

## 2019-04-23 MED ORDER — ALPRAZOLAM 0.5 MG PO TABS: ORAL_TABLET | ORAL | 0 refills | Status: AC

## 2019-04-23 MED FILL — ALPRAZolam 0.5 MG TABS: 0.5 | 15 days supply | Qty: 30 | Fill #0

## 2019-04-23 NOTE — Telephone Encounter (Signed)
Last Alprazolam RX: 02/11/19, #30 Last OV: 03/04/19 Next OV: 09/05/19 UDS: 02/11/19 CSC: 7/14/120

## 2019-05-01 MED FILL — LEVONOR-ETH ESTRAD 0.1-0.02: 0.1-20 | 28 days supply | Qty: 28 | Fill #0

## 2019-09-05 ENCOUNTER — Ambulatory Visit: Payer: 59 | Admitting: Family

## 2021-05-10 IMAGING — DX DG CHEST 2V
2 series · 2 of 2 positions shown · non-contrast
Comparison: Chest radiographs 01/02/2016, 09/03/2015

CLINICAL DATA: Pt with low grade fever, cough and congestion for 5
days. Works in the [employer] around [employer].no testing yet.

EXAM:
CHEST - 2 VIEW

[chest pa]
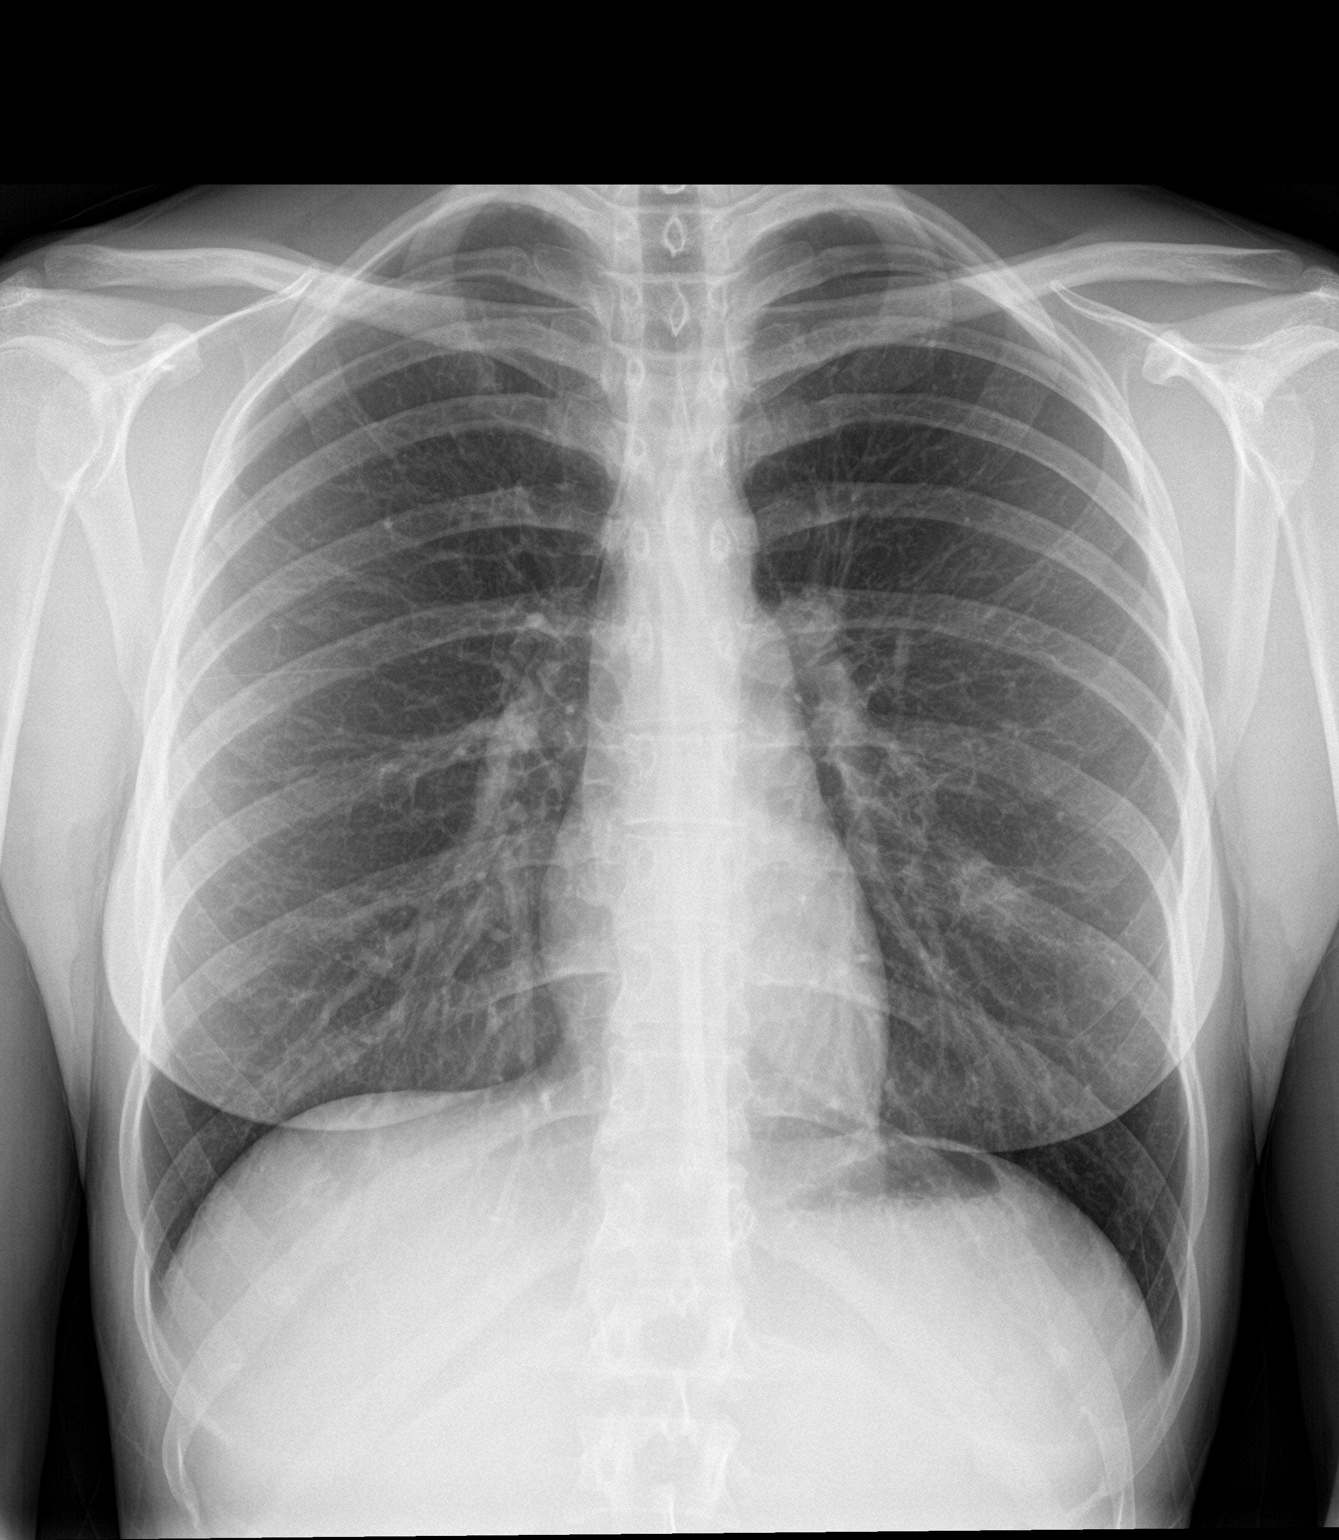

[chest lat]
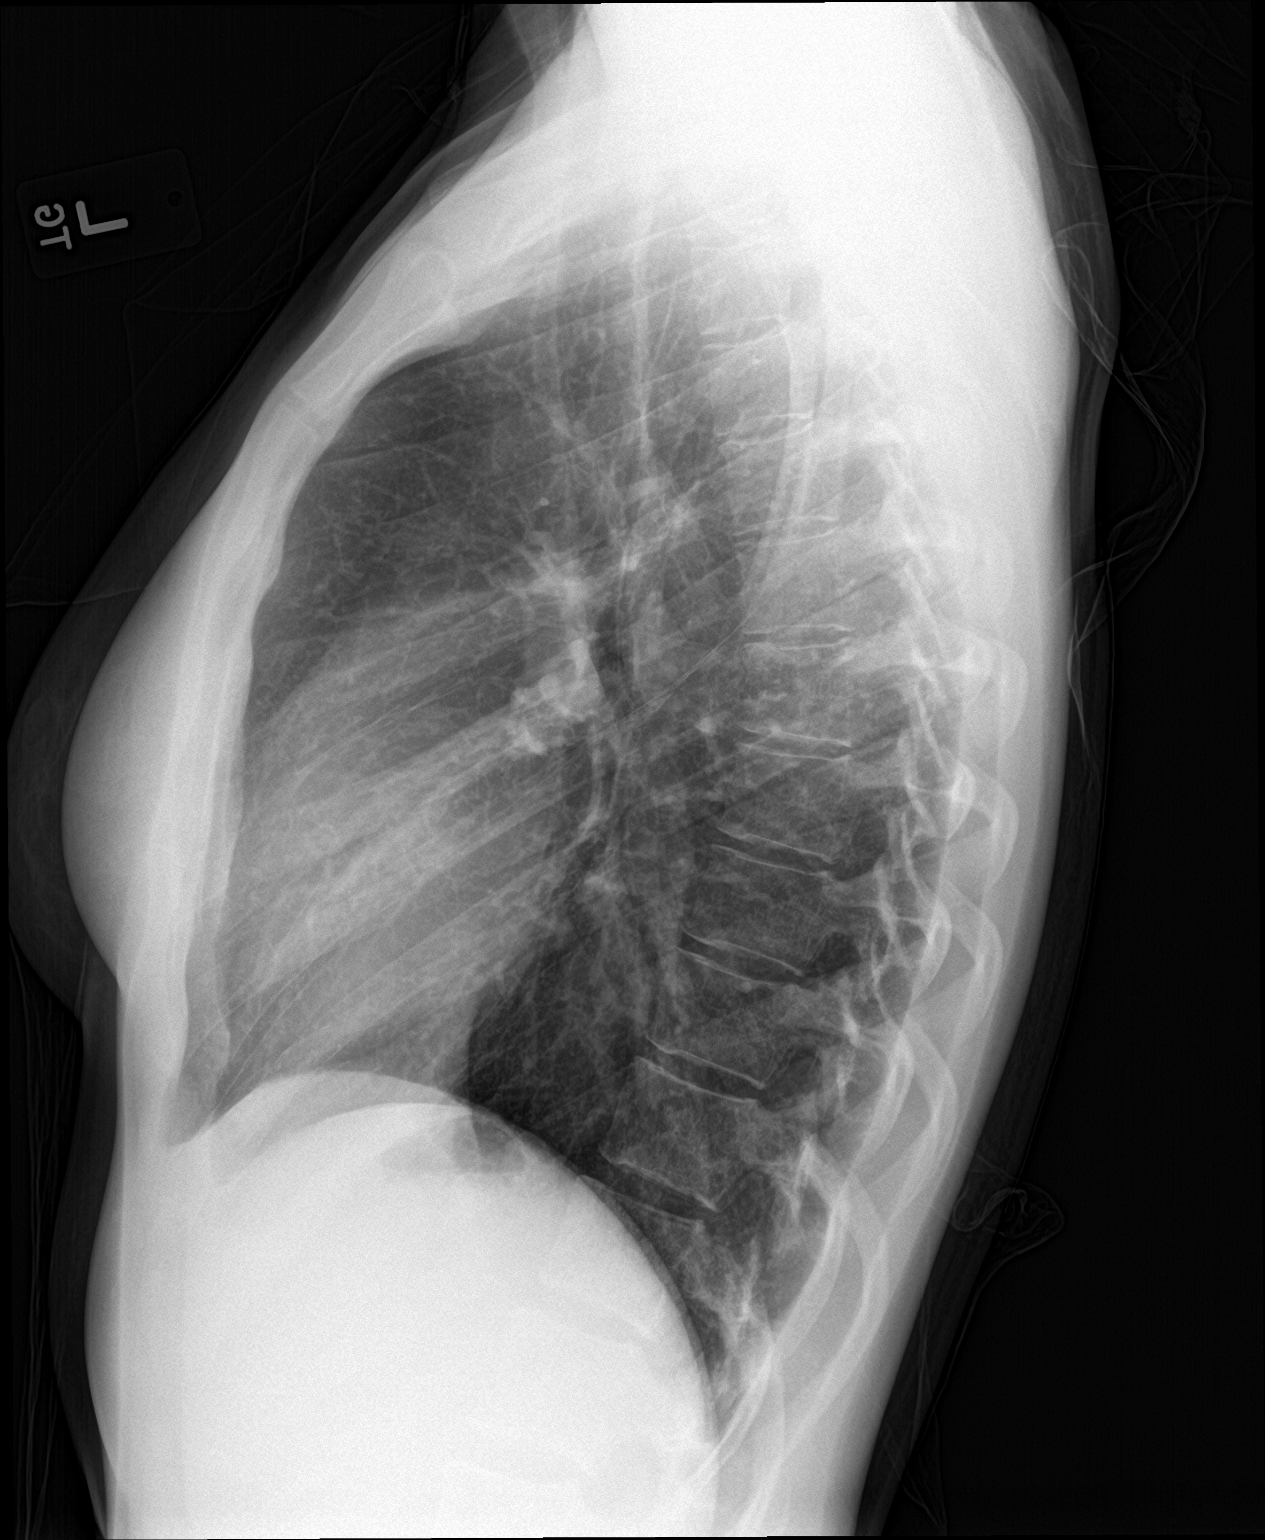

[2 of 2 positions shown; findings below may reference images not displayed]

FINDINGS: The heart size and mediastinal contours are within normal limits.
There is a subtle new opacity in the left mid lung. The right lung
is clear. No pneumothorax or pleural effusion. The visualized
skeletal structures are unremarkable.
IMPRESSION: Subtle new opacity in the left mid lung, could represent developing
infection.

## 2022-06-15 ENCOUNTER — Emergency Department (HOSPITAL_BASED_OUTPATIENT_CLINIC_OR_DEPARTMENT_OTHER): Payer: PRIVATE HEALTH INSURANCE

## 2022-06-15 ENCOUNTER — Other Ambulatory Visit: Payer: Self-pay

## 2022-06-15 ENCOUNTER — Other Ambulatory Visit (HOSPITAL_BASED_OUTPATIENT_CLINIC_OR_DEPARTMENT_OTHER): Payer: Self-pay

## 2022-06-15 ENCOUNTER — Encounter (HOSPITAL_BASED_OUTPATIENT_CLINIC_OR_DEPARTMENT_OTHER): Payer: Self-pay | Admitting: Emergency Medicine

## 2022-06-15 ENCOUNTER — Emergency Department (HOSPITAL_BASED_OUTPATIENT_CLINIC_OR_DEPARTMENT_OTHER)
Admission: EM | Admit: 2022-06-15 | Discharge: 2022-06-15 | Disposition: A | Discharge status: Home / Self Care | Payer: PRIVATE HEALTH INSURANCE | Attending: Emergency Medicine | Admitting: Emergency Medicine

## 2022-06-15 DIAGNOSIS — S6010XA Contusion of unspecified finger with damage to nail, initial encounter: Secondary | ICD-10-CM

## 2022-06-15 DIAGNOSIS — S62515A Nondisplaced fracture of proximal phalanx of left thumb, initial encounter for closed fracture: Secondary | ICD-10-CM | POA: Diagnosis not present

## 2022-06-15 DIAGNOSIS — M7989 Other specified soft tissue disorders: Secondary | ICD-10-CM | POA: Insufficient documentation

## 2022-06-15 DIAGNOSIS — S6992XA Unspecified injury of left wrist, hand and finger(s), initial encounter: Secondary | ICD-10-CM | POA: Diagnosis present

## 2022-06-15 DIAGNOSIS — S6702XA Crushing injury of left thumb, initial encounter: Secondary | ICD-10-CM

## 2022-06-15 DIAGNOSIS — W230XXA Caught, crushed, jammed, or pinched between moving objects, initial encounter: Secondary | ICD-10-CM | POA: Insufficient documentation

## 2022-06-15 DIAGNOSIS — Y92810 Car as the place of occurrence of the external cause: Secondary | ICD-10-CM | POA: Diagnosis not present

## 2022-06-15 MED ORDER — CEPHALEXIN 500 MG PO CAPS
500.0000 mg | ORAL_CAPSULE | Freq: Four times a day (QID) | ORAL | 0 refills | Status: AC
  Filled 2022-06-15: qty 20, 5d supply, fill #0

## 2022-06-15 NOTE — ED Provider Notes (Signed)
[facility] Provider Note   CSN: [id number] Arrival date & time: 06/15/22  1500     History  Chief Complaint  Patient presents with   thumb injury    Crystal [name] is a 31 y.o. female.  Crystal [name] is a 31 y.o. female who presents for evaluation of crush injury to the left finger.  Reports last night she accidentally shot her left thumb in the car door.  Reports bruising under the thumbnail but thumbnail is intact.  Reports throbbing pain at the tip of her thumb and that her digit pad feels swollen and painful.  She also reports some pain at the base of the thumb that is worse with movement.  No numbness or weakness.  No other injuries.  No lacerations or wounds.  The history is provided by the patient.       Home Medications Prior to Admission medications   Medication Sig Start Date End Date Taking? Authorizing Provider  cephALEXin (KEFLEX) 500 MG capsule Take 1 capsule (500 mg total) by mouth 4 (four) times daily. 06/15/22  Yes Jacqlyn Larsen, PA-C  albuterol (VENTOLIN HFA) 108 (90 Base) MCG/ACT inhaler Inhale 1-2 puffs into the lungs every 6 (six) hours as needed for wheezing or shortness of breath. 04/01/19   Noe Gens, PA-C  ALPRAZolam Duanne Moron) 0.5 MG tablet 1/2 to 1 tablet by mouth twice daily as needed 04/23/19   Debbrah Alar, NP  azithromycin (ZITHROMAX) 250 MG tablet Take 1 tablet (250 mg total) by mouth daily. Take first 2 tablets together, then 1 every day until finished. 04/01/19   Noe Gens, PA-C  benzonatate (TESSALON) 100 MG capsule Take 1-2 capsules (100-200 mg total) by mouth every 8 (eight) hours. 04/01/19   Noe Gens, PA-C  citalopram (CELEXA) 40 MG tablet Take 1 tablet (40 mg total) by mouth daily. 10/04/18   Debbrah Alar, NP  levonorgestrel-ethinyl estradiol (AVIANE,ALESSE,LESSINA) 0.1-20 MG-MCG tablet Take 1 tablet by mouth daily. 04/09/17   [provider]  methocarbamol (ROBAXIN) 500 MG tablet  Take 1 tablet (500 mg total) by mouth every 8 (eight) hours as needed for muscle spasms. 01/30/19   Colon Branch, MD  pantoprazole (PROTONIX) 40 MG tablet Take 1 tablet (40 mg total) by mouth daily. 07/18/16   Debbrah Alar, NP  valACYclovir (VALTREX) 1000 MG tablet Take 1 tablet (1,000 mg total) by mouth daily. 11/29/18   Debbrah Alar, NP      Allergies    Buspirone and Duloxetine hcl    Review of Systems   Review of Systems  Constitutional:  Negative for chills and fever.  Musculoskeletal:  Positive for arthralgias and joint swelling.  Skin:  Positive for color change.  Neurological:  Negative for weakness and numbness.    Physical Exam Updated Vital Signs BP (!) 133/101 (BP Location: Right Arm)   Pulse 75   Temp 98.2 F (36.8 C) (Oral)   Resp 17   Ht '5\' 6"'$  (1.676 m)   Wt 59 kg   SpO2 100%   BMI 20.98 kg/m  Physical Exam Vitals and nursing note reviewed.  Constitutional:      General: She is not in acute distress.    Appearance: Normal appearance. She is well-developed. She is not ill-appearing or diaphoretic.  HENT:     Head: Normocephalic and atraumatic.  Eyes:     General:        Right eye: No discharge.  Left eye: No discharge.  Pulmonary:     Effort: Pulmonary effort is normal. No respiratory distress.  Musculoskeletal:     Comments: Left thumb with tenderness and swelling of the distal phalanx.  Subungual hematoma noted.  Having somewhat swollen but compartment is soft.  There is also some tenderness at the MCP joint, pain increased with range of motion but patient is able to flex and extend finger.  Cap refill less than 2 seconds, normal sensation.  Neurological:     Mental Status: She is alert and oriented to person, place, and time.     Coordination: Coordination normal.  Psychiatric:        Mood and Affect: Mood normal.        Behavior: Behavior normal.     ED Results / Procedures / Treatments   Labs (all labs ordered are listed, but  only abnormal results are displayed) Labs Reviewed - No data to display  EKG None  Radiology DG Finger Thumb Left  Result Date: 06/15/2022 CLINICAL DATA:  Left thumb injury EXAM: LEFT THUMB 2+V COMPARISON:  None Available. FINDINGS: There is a very subtle cortical irregularity along the proximal radial margin of the proximal phalanx of the thumb which extends into the MCP joint. There is no significant overlying soft tissue abnormality. No radiopaque foreign body. No significant degenerative changes. IMPRESSION: Very subtle cortical irregularity along the proximal radial margin of the proximal phalanx of the thumb which extends into the MCP joint. This could be artifactual or represent a nondisplaced fracture. Correlate with point tenderness and site of injury. Electronically Signed   By: Marin Roberts M.D.   On: 06/15/2022 15:50    Procedures .Nail Removal  Date/Time: 06/15/2022 5:18 PM  Performed by: Jacqlyn Larsen, PA-C Authorized by: Jacqlyn Larsen, PA-C   Consent:    Consent obtained:  Verbal   Consent given by:  Patient   Risks, benefits, and alternatives were discussed: yes     Risks discussed:  Bleeding   Alternatives discussed:  No treatment Universal protocol:    Procedure explained and questions answered to patient or proxy's satisfaction: yes     Patient identity confirmed:  Verbally with patient Location:    Hand:  L thumb Pre-procedure details:    Skin preparation:  Chlorhexidine Trephination:    Subungual hematoma drained: yes     Trephination instrument:  Cautery Post-procedure details:    Dressing:  Gauze roll and splint   Procedure completion:  Tolerated well, no immediate complications     Medications Ordered in ED Medications - No data to display  ED Course/ Medical Decision Making/ A&P                           Medical Decision Making Amount and/or Complexity of Data Reviewed Radiology: ordered.   31 year old female presents with crush injury  to the left thumb that occurred last night.  Subungual hematoma noted with pressure and swelling in the distal phalanx.  Patient also with some pain at the MCP joint, worse with range of motion.  The thumb is neurovascularly intact.  Trephination performed with a moderate amount of blood released from subungual hematoma.  X-rays obtained and reviewed and interpreted independently, possible fracture at the base of the proximal phalanx, given that patient does have pain in this area will apply splint and have patient follow-up with hand surgery.  Keflex prescribed for infection prophylaxis after trephination.  At this time  there does not appear to be any evidence of an acute emergency medical condition requiring further emergent evaluation and the patient appears stable for discharge with appropriate outpatient follow up. Diagnosis and return precautions discussed with patient who verbalizes understanding and is agreeable to discharge.         Final Clinical Impression(s) / ED Diagnoses Final diagnoses:  Crush injury to thumb, left, initial encounter  Subungual hematoma of digit of hand, initial encounter  Closed nondisplaced fracture of proximal phalanx of left thumb, initial encounter    Rx / DC Orders ED Discharge Orders          Ordered    cephALEXin (KEFLEX) 500 MG capsule  4 times daily        06/15/22 1652              Jacqlyn Larsen, [place] 06/15/22 1723    Drenda Freeze, MD 06/15/22 [phone]

## 2022-06-15 NOTE — Discharge Instructions (Signed)
Use ibuprofen 600 mg and Tylenol 650 mg every 6 hours as needed for pain..  Trephination performed on thumb nail to help relieve pressure.  Take Keflex 4 times daily for the next 5 days to help prevent infection.  Can also use ice to help with pain and swelling.  Your x-ray showed a possible fracture at the base of the thumb.  Remain in finger splint and follow-up with hand surgery for further evaluation of finger fracture.

## 2022-06-15 NOTE — ED Triage Notes (Signed)
Pt crushed left thumb in car door yesterday.
